# Patient Record
Sex: Female | Born: 1970 | Race: Black or African American | Hispanic: No | Marital: Married | Smoking: Never smoker
Health system: Southern US, Community
[De-identification: ages and names within clinical notes are randomized; demographics above are authoritative.]

## PROBLEM LIST (undated history)

## (undated) DIAGNOSIS — R42 Dizziness and giddiness: Secondary | ICD-10-CM

## (undated) DIAGNOSIS — Z9889 Other specified postprocedural states: Secondary | ICD-10-CM

## (undated) DIAGNOSIS — M25511 Pain in right shoulder: Secondary | ICD-10-CM

## (undated) DIAGNOSIS — J343 Hypertrophy of nasal turbinates: Secondary | ICD-10-CM

## (undated) DIAGNOSIS — E7800 Pure hypercholesterolemia, unspecified: Secondary | ICD-10-CM

## (undated) DIAGNOSIS — N200 Calculus of kidney: Secondary | ICD-10-CM

## (undated) DIAGNOSIS — J329 Chronic sinusitis, unspecified: Secondary | ICD-10-CM

## (undated) DIAGNOSIS — I10 Essential (primary) hypertension: Secondary | ICD-10-CM

## (undated) DIAGNOSIS — Z8489 Family history of other specified conditions: Secondary | ICD-10-CM

## (undated) DIAGNOSIS — R112 Nausea with vomiting, unspecified: Secondary | ICD-10-CM

## (undated) HISTORY — PX: LITHOTRIPSY: SUR834

---

## 2000-11-30 ENCOUNTER — Inpatient Hospital Stay (HOSPITAL_COMMUNITY): Admission: AD | Admit: 2000-11-30 | Discharge: 2000-11-30 | Payer: Self-pay | Admitting: Obstetrics & Gynecology

## 2001-01-22 ENCOUNTER — Inpatient Hospital Stay (HOSPITAL_COMMUNITY): Admission: AD | Admit: 2001-01-22 | Discharge: 2001-01-22 | Payer: Self-pay | Admitting: Obstetrics & Gynecology

## 2001-02-01 ENCOUNTER — Inpatient Hospital Stay (HOSPITAL_COMMUNITY): Admission: AD | Admit: 2001-02-01 | Discharge: 2001-02-01 | Payer: Self-pay | Admitting: Obstetrics & Gynecology

## 2001-02-15 ENCOUNTER — Inpatient Hospital Stay (HOSPITAL_COMMUNITY): Admission: AD | Admit: 2001-02-15 | Discharge: 2001-02-15 | Payer: Self-pay | Admitting: Obstetrics & Gynecology

## 2001-02-15 ENCOUNTER — Encounter: Payer: Self-pay | Admitting: Obstetrics

## 2001-02-18 ENCOUNTER — Inpatient Hospital Stay (HOSPITAL_COMMUNITY): Admission: AD | Admit: 2001-02-18 | Discharge: 2001-02-20 | Payer: Self-pay | Admitting: Obstetrics & Gynecology

## 2001-02-25 ENCOUNTER — Encounter: Admission: RE | Admit: 2001-02-25 | Discharge: 2001-03-27 | Payer: Self-pay | Admitting: Family Medicine

## 2002-01-05 ENCOUNTER — Emergency Department (HOSPITAL_COMMUNITY): Admission: EM | Admit: 2002-01-05 | Discharge: 2002-01-05 | Payer: Self-pay | Admitting: Emergency Medicine

## 2002-01-31 ENCOUNTER — Inpatient Hospital Stay (HOSPITAL_COMMUNITY): Admission: AD | Admit: 2002-01-31 | Discharge: 2002-01-31 | Payer: Self-pay | Admitting: *Deleted

## 2002-02-12 ENCOUNTER — Inpatient Hospital Stay (HOSPITAL_COMMUNITY): Admission: AD | Admit: 2002-02-12 | Discharge: 2002-02-14 | Payer: Self-pay | Admitting: *Deleted

## 2002-02-17 ENCOUNTER — Inpatient Hospital Stay (HOSPITAL_COMMUNITY): Admission: AD | Admit: 2002-02-17 | Discharge: 2002-02-21 | Payer: Self-pay | Admitting: *Deleted

## 2008-02-14 ENCOUNTER — Encounter: Admission: RE | Admit: 2008-02-14 | Discharge: 2008-02-14 | Payer: Self-pay | Admitting: Internal Medicine

## 2009-12-26 ENCOUNTER — Emergency Department (HOSPITAL_BASED_OUTPATIENT_CLINIC_OR_DEPARTMENT_OTHER): Admission: EM | Admit: 2009-12-26 | Discharge: 2009-12-26 | Payer: Self-pay | Admitting: Emergency Medicine

## 2009-12-26 ENCOUNTER — Ambulatory Visit: Payer: Self-pay | Admitting: Diagnostic Radiology

## 2011-05-16 NOTE — Discharge Summary (Signed)
[facility]  Patient:    Janet Webb, Janet Webb Visit Number: [id number] MRN: [id number]          Service Type: GYN Location: [id number] Attending Physician:  Michaelle Copas Dictated by:   Ed Blalock. Burnadette Peter, M.D. Admit Date:  02/17/2002 Discharge Date: 02/21/2002                             Discharge Summary  DATE OF BIRTH:  Feb 04, 1971  HISTORY OF PRESENT ILLNESS:  This is a 40 year old, G3, P2-1-0-3, who presented on postpartum day #5 with light-headedness and fatigue since the day prior to admission.  She also complained of hemorrhoids, and mid lower abdominal pain, feeling terrible, and weak x2 days.  She had positive bleeding the day prior to the day of admission.  She had no visual changes, and a positive frontal headache.  MEDICATIONS: 1. Motrin. 2. Depo. 3. Phenergan. 4. Colace.  PAST MEDICAL HISTORY: 1. Normal spontaneous vaginal delivery x3, last on 02/12/02. 2. Anemia.  Hemoglobin was 5.2 during the hospitalization.  PAST GYNECOLOGICAL HISTORY:  No STDs or abnormal Paps.  PAST SURGICAL HISTORY:  None.  HOSPITALIZATIONS:  Only with pregnancies.  SOCIAL HISTORY:  Denies tobacco, alcohol, or drug use.  ADMISSION PHYSICAL EXAMINATION:  VITAL SIGNS:  Temperature 98.8, heart rate 72 to 77, respiratory rate 20, blood pressure 203/107 and 176/99.  GENERAL:  She is laying in bed in no acute distress.  HEENT:  Oropharynx is clear.  Fundus examination was normal.  LUNGS:  Clear to auscultation.  NECK:  Supple.  There is no lymphadenopathy.  HEART:  Regular rate and rhythm without murmur.  BREASTS:  Leaking fluid.  ABDOMEN:  Fundus was firm and below the umbilicus.  No right upper quadrant pain or tenderness to palpation.  RECTAL:  Large hemorrhoids.  SKIN:  Within normal limits.  EXTREMITIES:  Deep tendon reflexes 3+ with one beat of clonus.  LABORATORY DATA:  Urinalysis showed specific gravity 1015, and  normal. Hemoglobin 7.1, white count 8.5, platelets 355.  HOSPITAL COURSE:  The patient was admitted for postpartum preeclampsia.  She received magnesium sulfate, and PIH labs were done which were normal with the exception of hypokalemia which was orally replaced.  Her magnesium was discontinued, and she was observed.  Given her elevated blood pressures into the 200s systolic, she was started on IV labetalol and then a labetalol drip. This was discontinued, and then she was placed on p.o. labetalol 300 mg b.i.d. She continued to have blood pressure elevations with the labetalol, in addition to intermittent IV labetalol 10 and 20 mg.  On hospital day #5, she was placed on Procardia XL in the a.m.  She tolerated this well.  Her blood pressures trended down with blood pressures to 146/82, then 135/92, then 120/74.  The patient was completely asymptomatic at this time.  Her physical examination was consistent with the a.m. physical examination with no neurological deficits.  Cardiovascular was regular rate and rhythm.  Her lungs were clear.  There was no edema.  She had 2+ deep tendon reflexes at discharge.  She also had no visual changes or headache.  DIET:  Low salt diet.  ACTIVITY:  Ad lib.  DISCHARGE MEDICATIONS: 1. Procardia XL 60 mg p.o. q.d. 2. Resume her previous postpartum medications as directed.  FOLLOWUP:  She is to be seen by Dr. Waylan Boga within two days for blood pressure check, and then she needs  a routine follow by Dr. Waylan Boga, who is her primary physician for management of her blood pressure issues and gestational hypertension/preeclampsia.  CONDITION ON DISCHARGE:  Improved.  DISCHARGE DIAGNOSES: 1. Status post normal spontaneous vaginal delivery. 2. Preeclampsia during delivery. 3. Postpartum preeclampsia versus elevated blood pressures with chronic    hypertension.Dictated by:   Ed Blalock. Burnadette Peter, M.D. Attending Physician:  Michaelle Copas DD:  02/21/02 TD:   02/22/02 Job: [id number]

## 2013-03-08 ENCOUNTER — Emergency Department (HOSPITAL_BASED_OUTPATIENT_CLINIC_OR_DEPARTMENT_OTHER)
Admission: EM | Admit: 2013-03-08 | Discharge: 2013-03-09 | Disposition: A | Discharge status: Home / Self Care | Payer: BC Managed Care – PPO | Attending: Emergency Medicine | Admitting: Emergency Medicine

## 2013-03-08 ENCOUNTER — Emergency Department (HOSPITAL_BASED_OUTPATIENT_CLINIC_OR_DEPARTMENT_OTHER): Payer: BC Managed Care – PPO

## 2013-03-08 ENCOUNTER — Encounter (HOSPITAL_BASED_OUTPATIENT_CLINIC_OR_DEPARTMENT_OTHER): Payer: Self-pay | Admitting: Emergency Medicine

## 2013-03-08 DIAGNOSIS — E7800 Pure hypercholesterolemia, unspecified: Secondary | ICD-10-CM | POA: Insufficient documentation

## 2013-03-08 DIAGNOSIS — IMO0001 Reserved for inherently not codable concepts without codable children: Secondary | ICD-10-CM | POA: Insufficient documentation

## 2013-03-08 DIAGNOSIS — I10 Essential (primary) hypertension: Secondary | ICD-10-CM | POA: Insufficient documentation

## 2013-03-08 DIAGNOSIS — M62838 Other muscle spasm: Secondary | ICD-10-CM

## 2013-03-08 DIAGNOSIS — Z87442 Personal history of urinary calculi: Secondary | ICD-10-CM | POA: Insufficient documentation

## 2013-03-08 DIAGNOSIS — Z79899 Other long term (current) drug therapy: Secondary | ICD-10-CM | POA: Insufficient documentation

## 2013-03-08 HISTORY — DX: Pure hypercholesterolemia, unspecified: E78.00

## 2013-03-08 HISTORY — DX: Essential (primary) hypertension: I10

## 2013-03-08 HISTORY — DX: Calculus of kidney: N20.0

## 2013-03-08 MED ORDER — METHOCARBAMOL 500 MG PO TABS
500.0000 mg | ORAL_TABLET | Freq: Once | ORAL | Status: AC
  Administered 2013-03-09: 500 mg via ORAL
  Filled 2013-03-08: qty 1

## 2013-03-08 MED ORDER — TRAMADOL HCL 50 MG PO TABS
50.0000 mg | ORAL_TABLET | Freq: Once | ORAL | Status: DC
  Filled 2013-03-08: qty 1

## 2013-03-08 NOTE — ED Provider Notes (Signed)
History     CSN: [id number]  Arrival date & time 03/08/13  2324   First MD Initiated Contact with Patient 03/08/13 2338      Chief Complaint  Patient presents with  . Neck Pain    (Consider location/radiation/quality/duration/timing/severity/associated sxs/prior treatment) Patient is a 42 y.o. female presenting with neck pain. The history is provided by the patient.  Neck Pain Pain location: r> L. Quality:  Cramping Pain radiates to:  L shoulder and R shoulder Pain severity:  Moderate Pain is:  Same all the time Onset quality:  Sudden (slept on it funny sunday night) Timing:  Constant Progression:  Unchanged Chronicity:  Recurrent Context: not fall and not recent injury   Relieved by:  Nothing Associated symptoms: no fever     Past Medical History  Diagnosis Date  . Hypertension   . High cholesterol   . Kidney stones     Past Surgical History  Procedure Laterality Date  . Lithotripsy      No family history on file.  History  Substance Use Topics  . Smoking status: Never Smoker   . Smokeless tobacco: Not on file  . Alcohol Use: Yes     Comment: rarely    OB History   Grav Para Term Preterm Abortions TAB SAB Ect Mult Living                  Review of Systems  Constitutional: Negative for fever.  HENT: Positive for neck pain.   Skin: Negative for rash.  All other systems reviewed and are negative.    Allergies  Codeine  Home Medications   Current Outpatient Rx  Name  Route  Sig  Dispense  Refill  . lisinopril-hydrochlorothiazide (PRINZIDE,ZESTORETIC) 10-12.5 MG per tablet   Oral   Take 1 tablet by mouth daily.         . montelukast (SINGULAIR) 10 MG tablet   Oral   Take 10 mg by mouth at bedtime.         . pravastatin (PRAVACHOL) 40 MG tablet   Oral   Take 40 mg by mouth daily.           BP 132/85  Pulse 74  Temp(Src) 97.9 F (36.6 C) (Oral)  Resp 16  Ht 5\' 5"  (1.651 m)  Wt 155 lb (70.308 kg)  BMI 25.79 kg/m2  SpO2  100%  LMP 02/15/2013  Physical Exam  Constitutional: She is oriented to person, place, and time. She appears well-developed and well-nourished. No distress.  HENT:  Head: Normocephalic and atraumatic.  Mouth/Throat: Oropharynx is clear and moist. No oropharyngeal exudate.  Eyes: Conjunctivae are normal. Pupils are equal, round, and reactive to light.  Neck: Normal range of motion. Neck supple. No tracheal deviation present. No thyromegaly present.  Spasm in the trapezius muscle  Cardiovascular: Normal rate, regular rhythm and intact distal pulses.   Pulmonary/Chest: Effort normal and breath sounds normal. She has no wheezes. She has no rales.  Abdominal: Soft. Bowel sounds are normal. There is no tenderness. There is no rebound and no guarding.  Musculoskeletal: Normal range of motion. She exhibits no tenderness.  Lymphadenopathy:    She has no cervical adenopathy.  Neurological: She is alert and oriented to person, place, and time. She has normal reflexes. She exhibits normal muscle tone.  5/5 strength x 4  Skin: Skin is warm and dry.  Psychiatric: She has a normal mood and affect.    ED Course  Procedures (including critical care  time)  Labs Reviewed - No data to display No results found.   No diagnosis found.    MDM  Neck spasms.  Will treat for pain and spasm.         Jasmine Awe, MD 03/08/13 2355

## 2013-03-08 NOTE — ED Notes (Signed)
Woke up Monday morning with stiff neck. Has been taking flexeril, naproxen, and Ibuprofen and it has not gotten any better.  No known injury.

## 2013-03-08 NOTE — ED Notes (Signed)
Awakened on 03/06/13 with muscle pain in her neck.  Bilateral shoulders feel tense.  She thinks she slept on her neck wrong.  Has taken Flexeril, IBU, and Naproxen with minimal relief.  Movement worsens the pain. Full ROM in neck.  Denies rash or fever.

## 2013-03-09 MED ORDER — MELOXICAM 7.5 MG PO TABS: 7.5000 mg | ORAL_TABLET | Freq: Every day | ORAL | Status: DC

## 2013-03-09 MED ORDER — METHOCARBAMOL 750 MG PO TABS: 750.0000 mg | ORAL_TABLET | Freq: Two times a day (BID) | ORAL | Status: DC

## 2013-03-09 NOTE — ED Notes (Signed)
Pt. States she can't take Ultram, makes her vomit.

## 2013-10-26 ENCOUNTER — Encounter (HOSPITAL_BASED_OUTPATIENT_CLINIC_OR_DEPARTMENT_OTHER): Payer: Self-pay | Admitting: Emergency Medicine

## 2013-10-26 ENCOUNTER — Emergency Department (HOSPITAL_BASED_OUTPATIENT_CLINIC_OR_DEPARTMENT_OTHER)
Admission: EM | Admit: 2013-10-26 | Discharge: 2013-10-26 | Disposition: A | Discharge status: Home / Self Care | Payer: BC Managed Care – PPO | Attending: Emergency Medicine | Admitting: Emergency Medicine

## 2013-10-26 DIAGNOSIS — I10 Essential (primary) hypertension: Secondary | ICD-10-CM | POA: Insufficient documentation

## 2013-10-26 DIAGNOSIS — Z87442 Personal history of urinary calculi: Secondary | ICD-10-CM | POA: Insufficient documentation

## 2013-10-26 DIAGNOSIS — Z79899 Other long term (current) drug therapy: Secondary | ICD-10-CM | POA: Insufficient documentation

## 2013-10-26 DIAGNOSIS — E7800 Pure hypercholesterolemia, unspecified: Secondary | ICD-10-CM | POA: Insufficient documentation

## 2013-10-26 DIAGNOSIS — M5382 Other specified dorsopathies, cervical region: Secondary | ICD-10-CM | POA: Insufficient documentation

## 2013-10-26 DIAGNOSIS — Z791 Long term (current) use of non-steroidal anti-inflammatories (NSAID): Secondary | ICD-10-CM | POA: Insufficient documentation

## 2013-10-26 DIAGNOSIS — M542 Cervicalgia: Secondary | ICD-10-CM

## 2013-10-26 MED ORDER — MELOXICAM 7.5 MG PO TABS: 7.5000 mg | ORAL_TABLET | Freq: Every day | ORAL | Status: AC

## 2013-10-26 MED ORDER — METHOCARBAMOL 750 MG PO TABS: 750.0000 mg | ORAL_TABLET | Freq: Two times a day (BID) | ORAL | Status: DC

## 2013-10-26 MED ORDER — KETOROLAC TROMETHAMINE 60 MG/2ML IM SOLN
60.0000 mg | Freq: Once | INTRAMUSCULAR | Status: AC
  Administered 2013-10-26: 60 mg via INTRAMUSCULAR
  Filled 2013-10-26: qty 2

## 2013-10-26 NOTE — ED Provider Notes (Signed)
CSN: [id number]     Arrival date & time 10/26/13  0906 History   First MD Initiated Contact with Patient 10/26/13 [phone]     Chief Complaint  Patient presents with  . muscle spasm right shoulder    (Consider location/radiation/quality/duration/timing/severity/associated sxs/prior Treatment) The history is provided by the patient.   Patient here complaining of a one-week history of right-sided upper trapezius pain. Pain is worse with certain movements and characterized as sharp. History of similar symptoms associated with muscle strain and has been treated before with Robaxin and Mobic. 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 She denies any pain in his shoulder. No joint tenderness or swelling. States she feels it might be related to reduce his legs. No numbness or tingling in her right hand. Past Medical History  Diagnosis Date  . Hypertension   . High cholesterol   . Kidney stones    Past Surgical History  Procedure Laterality Date  . Lithotripsy     History reviewed. No pertinent family history. History  Substance Use Topics  . Smoking status: Never Smoker   . Smokeless tobacco: Not on file  . Alcohol Use: Yes     Comment: rarely   OB History   Grav Para Term Preterm Abortions TAB SAB Ect Mult Living                 Review of Systems  All other systems reviewed and are negative.    Allergies  Codeine  Home Medications   Current Outpatient Rx  Name  Route  Sig  Dispense  Refill  . lisinopril-hydrochlorothiazide (PRINZIDE,ZESTORETIC) 10-12.5 MG per tablet   Oral   Take 1 tablet by mouth daily.         . meloxicam (MOBIC) 7.5 MG tablet   Oral   Take 1 tablet (7.5 mg total) by mouth daily. Take with food   7 tablet   0   . methocarbamol (ROBAXIN) 750 MG tablet   Oral   Take 1 tablet (750 mg total) by mouth 2 (two) times daily.   20 tablet   0   . montelukast (SINGULAIR) 10 MG tablet   Oral  Take 10 mg by mouth at bedtime.         . pravastatin (PRAVACHOL) 40 MG tablet   Oral   Take 40 mg by mouth daily.          BP 134/76  Pulse 81  Resp 16  SpO2 100%  LMP 10/26/2013 Physical Exam  Nursing note and vitals reviewed. Constitutional: She is oriented to person, place, and time. She appears well-developed and well-nourished.  Non-toxic appearance. No distress.  HENT:  Head: Normocephalic and atraumatic.  Eyes: Conjunctivae, EOM and lids are normal. Pupils are equal, round, and reactive to light.  Neck: Normal range of motion. Neck supple. No tracheal deviation present. No mass present.  Cardiovascular: Normal rate, regular rhythm and normal heart sounds.  Exam reveals no gallop.   No murmur heard. Pulmonary/Chest: Effort normal and breath sounds normal. No stridor. No respiratory distress. She has no decreased breath sounds. She has no wheezes. She has no rhonchi. She has no rales.  Abdominal: Soft. Normal appearance and bowel sounds are normal. She exhibits no distension. There is no tenderness. There is no rebound and no CVA tenderness.  Musculoskeletal: Normal range of motion. She exhibits no edema and no tenderness.  Arms: Neurological: She is alert and oriented to person, place, and time. She has normal strength. No cranial nerve deficit or sensory deficit. GCS eye subscore is 4. GCS verbal subscore is 5. GCS motor subscore is 6.  Skin: Skin is warm and dry. No abrasion and no rash noted.  Psychiatric: She has a normal mood and affect. Her speech is normal and behavior is normal.    ED Course  Procedures (including critical care time) Labs Review Labs Reviewed - No data to display Imaging Review No results found.  EKG Interpretation   None       MDM  No diagnosis found. Patient with out evidence of joint instability. Pain is pinpoint at her right upper trapezius. Suspect that she has a trigger point issue. Given Toradol IM here and will give  referral to sports medicine.    Toy Baker, MD 10/26/13 1003

## 2013-10-26 NOTE — ED Notes (Signed)
Thinks has been sleeping wrong on right shoulder having muscle spasms and tightness from top of right shoulder to neck.has taken naprosyn etc with no relief. States she works for a [employer] and they told her " to go ahead and come to urgent care and get an xray to be sure all is ok"

## 2013-11-01 ENCOUNTER — Ambulatory Visit: Payer: BC Managed Care – PPO | Admitting: Family Medicine

## 2015-01-23 ENCOUNTER — Other Ambulatory Visit: Payer: Self-pay | Admitting: Orthopaedic Surgery

## 2015-01-23 DIAGNOSIS — M545 Low back pain: Secondary | ICD-10-CM

## 2015-02-06 ENCOUNTER — Ambulatory Visit
Admission: RE | Admit: 2015-02-06 | Discharge: 2015-02-06 | Disposition: A | Discharge status: Home / Self Care | Payer: BLUE CROSS/BLUE SHIELD | Source: Ambulatory Visit | Attending: Orthopaedic Surgery | Admitting: Orthopaedic Surgery

## 2015-02-06 DIAGNOSIS — M545 Low back pain: Secondary | ICD-10-CM

## 2015-06-17 ENCOUNTER — Encounter (HOSPITAL_BASED_OUTPATIENT_CLINIC_OR_DEPARTMENT_OTHER): Payer: Self-pay | Admitting: *Deleted

## 2015-06-17 ENCOUNTER — Emergency Department (HOSPITAL_BASED_OUTPATIENT_CLINIC_OR_DEPARTMENT_OTHER)
Admission: EM | Admit: 2015-06-17 | Discharge: 2015-06-17 | Disposition: A | Discharge status: Home / Self Care | Payer: BLUE CROSS/BLUE SHIELD | Attending: Emergency Medicine | Admitting: Emergency Medicine

## 2015-06-17 DIAGNOSIS — Z791 Long term (current) use of non-steroidal anti-inflammatories (NSAID): Secondary | ICD-10-CM | POA: Diagnosis not present

## 2015-06-17 DIAGNOSIS — Z87442 Personal history of urinary calculi: Secondary | ICD-10-CM | POA: Insufficient documentation

## 2015-06-17 DIAGNOSIS — Z8639 Personal history of other endocrine, nutritional and metabolic disease: Secondary | ICD-10-CM | POA: Insufficient documentation

## 2015-06-17 DIAGNOSIS — Z79899 Other long term (current) drug therapy: Secondary | ICD-10-CM | POA: Insufficient documentation

## 2015-06-17 DIAGNOSIS — J069 Acute upper respiratory infection, unspecified: Secondary | ICD-10-CM | POA: Insufficient documentation

## 2015-06-17 DIAGNOSIS — J029 Acute pharyngitis, unspecified: Secondary | ICD-10-CM | POA: Diagnosis present

## 2015-06-17 DIAGNOSIS — I10 Essential (primary) hypertension: Secondary | ICD-10-CM | POA: Diagnosis not present

## 2015-06-17 LAB — RAPID STREP SCREEN (MED CTR MEBANE ONLY): Streptococcus, Group A Screen (Direct): NEGATIVE

## 2015-06-17 MED ORDER — ALBUTEROL SULFATE HFA 108 (90 BASE) MCG/ACT IN AERS
2.0000 | INHALATION_SPRAY | RESPIRATORY_TRACT | Status: DC | PRN
  Administered 2015-06-17: 2 via RESPIRATORY_TRACT
  Filled 2015-06-17: qty 6.7

## 2015-06-17 NOTE — Discharge Instructions (Signed)
Upper Respiratory Infection, Adult An upper respiratory infection (URI) is also sometimes known as the common cold. The upper respiratory tract includes the nose, sinuses, throat, trachea, and bronchi. Bronchi are the airways leading to the lungs. Most people improve within 1 week, but symptoms can last up to 2 weeks. A residual cough may last even longer.  CAUSES Many different viruses can infect the tissues lining the upper respiratory tract. The tissues become irritated and inflamed and often become very moist. Mucus production is also common. A cold is contagious. You can easily spread the virus to others by oral contact. This includes kissing, sharing a glass, coughing, or sneezing. Touching your mouth or nose and then touching a surface, which is then touched by another person, can also spread the virus. SYMPTOMS  Symptoms typically develop 1 to 3 days after you come in contact with a cold virus. Symptoms vary from person to person. They may include:  Runny nose.  Sneezing.  Nasal congestion.  Sinus irritation.  Sore throat.  Loss of voice (laryngitis).  Cough.  Fatigue.  Muscle aches.  Loss of appetite.  Headache.  Low-grade fever. DIAGNOSIS  You might diagnose your own cold based on familiar symptoms, since most people get a cold 2 to 3 times a year. Your caregiver can confirm this based on your exam. Most importantly, your caregiver can check that your symptoms are not due to another disease such as strep throat, sinusitis, pneumonia, asthma, or epiglottitis. Blood tests, throat tests, and X-rays are not necessary to diagnose a common cold, but they may sometimes be helpful in excluding other more serious diseases. Your caregiver will decide if any further tests are required. RISKS AND COMPLICATIONS  You may be at risk for a more severe case of the common cold if you smoke cigarettes, have chronic heart disease (such as heart failure) or lung disease (such as asthma), or if  you have a weakened immune system. The very young and very old are also at risk for more serious infections. Bacterial sinusitis, middle ear infections, and bacterial pneumonia can complicate the common cold. The common cold can worsen asthma and chronic obstructive pulmonary disease (COPD). Sometimes, these complications can require emergency medical care and may be life-threatening. PREVENTION  The best way to protect against getting a cold is to practice good hygiene. Avoid oral or hand contact with people with cold symptoms. Wash your hands often if contact occurs. There is no clear evidence that vitamin C, vitamin E, echinacea, or exercise reduces the chance of developing a cold. However, it is always recommended to get plenty of rest and practice good nutrition. TREATMENT  Treatment is directed at relieving symptoms. There is no cure. Antibiotics are not effective, because the infection is caused by a virus, not by bacteria. Treatment may include:  Increased fluid intake. Sports drinks offer valuable electrolytes, sugars, and fluids.  Breathing heated mist or steam (vaporizer or shower).  Eating chicken soup or other clear broths, and maintaining good nutrition.  Getting plenty of rest.  Using gargles or lozenges for comfort.  Controlling fevers with ibuprofen or acetaminophen as directed by your caregiver.  Increasing usage of your inhaler if you have asthma. Zinc gel and zinc lozenges, taken in the first 24 hours of the common cold, can shorten the duration and lessen the severity of symptoms. Pain medicines may help with fever, muscle aches, and throat pain. A variety of non-prescription medicines are available to treat congestion and runny nose. Your caregiver   can make recommendations and may suggest nasal or lung inhalers for other symptoms.  HOME CARE INSTRUCTIONS   Only take over-the-counter or prescription medicines for pain, discomfort, or fever as directed by your  caregiver.  Use a warm mist humidifier or inhale steam from a shower to increase air moisture. This may keep secretions moist and make it easier to breathe.  Drink enough water and fluids to keep your urine clear or pale yellow.  Rest as needed.  Return to work when your temperature has returned to normal or as your caregiver advises. You may need to stay home longer to avoid infecting others. You can also use a face mask and careful hand washing to prevent spread of the virus. SEEK MEDICAL CARE IF:   After the first few days, you feel you are getting worse rather than better.  You need your caregiver's advice about medicines to control symptoms.  You develop chills, worsening shortness of breath, or brown or red sputum. These may be signs of pneumonia.  You develop yellow or brown nasal discharge or pain in the face, especially when you bend forward. These may be signs of sinusitis.  You develop a fever, swollen neck glands, pain with swallowing, or white areas in the back of your throat. These may be signs of strep throat. SEEK IMMEDIATE MEDICAL CARE IF:   You have a fever.  You develop severe or persistent headache, ear pain, sinus pain, or chest pain.  You develop wheezing, a prolonged cough, cough up blood, or have a change in your usual mucus (if you have chronic lung disease).  You develop sore muscles or a stiff neck. Document Released: 06/10/2001 Document Revised: 03/08/2012 Document Reviewed: 03/22/2014 Exit[employer] Patient Information 2015 [employer]. This information is not intended to replace advice given to you by your health care provider. Make sure you discuss any questions you have with your health care provider.  

## 2015-06-17 NOTE — ED Notes (Signed)
Sore throat for the past week,, chills at time

## 2015-06-17 NOTE — ED Provider Notes (Signed)
CSN: [id number]     Arrival date & time 06/17/15  1053 History   First MD Initiated Contact with Patient 06/17/15 1100     Chief Complaint  Patient presents with  . Sore Throat     (Consider location/radiation/quality/duration/timing/severity/associated sxs/prior Treatment) HPI 44 year old female comes in today complaining of upper respiratory infection symptoms. She states that it began about a week ago with some sinus pressure and congestion. She then began having some sore throat and cough. The coughing awoke her during the night. She has had some chills but has not had any fever. She has not had any nausea or vomiting. Active cough of discolored sputum. She has not been dyspneic except sometimes at night when she wakes up coughing. He has no previous history of pneumonia. Past Medical History  Diagnosis Date  . Hypertension   . High cholesterol   . Kidney stones    Past Surgical History  Procedure Laterality Date  . Lithotripsy     History reviewed. No pertinent family history. History  Substance Use Topics  . Smoking status: Never Smoker   . Smokeless tobacco: Not on file  . Alcohol Use: Yes     Comment: rarely   OB History    No data available     Review of Systems    Allergies  Codeine  Home Medications   Prior to Admission medications   Medication Sig Start Date End Date Taking? Authorizing Provider  ezetimibe (ZETIA) 10 MG tablet Take 10 mg by mouth daily.   Yes Historical Provider, MD  HYDROCHLOROTHIAZIDE PO Take by mouth.   Yes Historical Provider, MD  LOSARTAN POTASSIUM PO Take by mouth.   Yes Historical Provider, MD  lisinopril-hydrochlorothiazide (PRINZIDE,ZESTORETIC) 10-12.5 MG per tablet Take 1 tablet by mouth daily.    Historical Provider, MD  meloxicam (MOBIC) 7.5 MG tablet Take 1 tablet (7.5 mg total) by mouth daily. Take with food 10/26/13   Lacretia Leigh, MD  methocarbamol (ROBAXIN) 750 MG tablet Take 1 tablet (750 mg total) by mouth 2 (two)  times daily. 10/26/13   Lacretia Leigh, MD  montelukast (SINGULAIR) 10 MG tablet Take 10 mg by mouth at bedtime.    Historical Provider, MD  pravastatin (PRAVACHOL) 40 MG tablet Take 40 mg by mouth daily.    Historical Provider, MD   BP 130/90 mmHg  Pulse 77  Temp(Src) 98.3 F (36.8 C) (Oral)  Resp 18  Ht 5' 5"  (1.651 m)  Wt 165 lb (74.844 kg)  BMI 27.46 kg/m2  SpO2 100% Physical Exam  Constitutional: She is oriented to person, place, and time. She appears well-developed and well-nourished.  HENT:  Head: Normocephalic and atraumatic.  Right Ear: External ear normal.  Left Ear: External ear normal.  Nose: Nose normal.  Mouth/Throat: Oropharynx is clear and moist.  Eyes: Conjunctivae and EOM are normal. Pupils are equal, round, and reactive to light.  Neck: Normal range of motion. Neck supple.  Cardiovascular: Normal rate, regular rhythm, normal heart sounds and intact distal pulses.   Pulmonary/Chest: Effort normal and breath sounds normal.  Abdominal: Soft. Bowel sounds are normal.  Musculoskeletal: Normal range of motion.  Neurological: She is alert and oriented to person, place, and time. She has normal reflexes.  Skin: Skin is warm and dry.  Psychiatric: She has a normal mood and affect. Her behavior is normal. Judgment and thought content normal.  Nursing note and vitals reviewed.   ED Course  Procedures (including critical care time) Labs Review Labs Reviewed  RAPID STREP SCREEN (NOT AT [facility])  CULTURE, GROUP A STREP    Imaging Review No results found.   EKG Interpretation None      MDM   Final diagnoses:  URI (upper respiratory infection)   44 year old female with symptoms consistent with upper respiratory infection. Patient had strep screen done which is negative. She is given albuterol HFA for cough. Advise regarding return precautions and need for follow-up.  Pattricia Boss, MD 06/19/15 [phone]

## 2015-06-20 LAB — CULTURE, GROUP A STREP

## 2015-11-02 ENCOUNTER — Encounter: Payer: Self-pay | Admitting: *Deleted

## 2015-11-07 NOTE — Discharge Instructions (Signed)
[facility]  What is Functional Endoscopic Sinus Surgery?  The Surgery involves making the natural openings of the sinuses larger by removing the bony partitions that separate the sinuses from the nasal cavity.  The natural sinus lining is preserved as much as possible to allow the sinuses to resume normal function after the surgery.  In some patients nasal polyps (excessively swollen lining of the sinuses) may be removed to relieve obstruction of the sinus openings.  The surgery is performed through the nose using lighted scopes, which eliminates the need for incisions on the face.  A septoplasty is a different procedure which is sometimes performed with sinus surgery.  It involves straightening the boy partition that separates the two sides of your nose.  A crooked or deviated septum may need repair if is obstructing the sinuses or nasal airflow.  Turbinate reduction is also often performed during sinus surgery.  The turbinates are bony proturberances from the side walls of the nose which swell and can obstruct the nose in patients with sinus and allergy problems.  Their size can be surgically reduced to help relieve nasal obstruction.  What Can Sinus Surgery Do For Me?  Sinus surgery can reduce the frequency of sinus infections requiring antibiotic treatment.  This can provide improvement in nasal congestion, post-nasal drainage, facial pressure and nasal obstruction.  Surgery will NOT prevent you from ever having an infection again, so it usually only for patients who get infections 4 or more times yearly requiring antibiotics, or for infections that do not clear with antibiotics.  It will not cure nasal allergies, so patients with allergies may still require medication to treat their allergies after surgery. Surgery may improve headaches related to sinusitis, however, some people will continue to  require medication to control sinus headaches related to allergies.  Surgery will do nothing for other forms of headache (migraine, tension or cluster).  What Are the Risks of Endoscopic Sinus Surgery?  Current techniques allow surgery to be performed safely with little risk, however, there are rare complications that patients should be aware of.  Because the sinuses are located around the eyes, there is risk of eye injury, including blindness, though again, this would be quite rare. This is usually a result of bleeding behind the eye during surgery, which puts the vision oat risk, though there are treatments to protect the vision and prevent permanent disrupted by surgery causing a leak of the spinal fluid that surrounds the brain.  More serious complications would include bleeding inside the brain cavity or damage to the brain.  Again, all of these complications are uncommon, and spinal fluid leaks can be safely managed surgically if they occur.  The most common complication of sinus surgery is bleeding from the nose, which may require packing or cauterization of the nose.  Continued sinus have polyps may experience recurrence of the polyps requiring revision surgery.  Alterations of sense of smell or injury to the tear ducts are also rare complications.   What is the Surgery Like, and what is the Recovery?  The Surgery usually takes a couple of hours to perform, and is usually performed under a general anesthetic (completely asleep).  Patients are usually discharged home after a couple of hours.  Sometimes during surgery it is necessary to pack the nose to control bleeding, and the packing is left in place for 24 - 48 hours, and removed by your surgeon.  If a septoplasty was performed during the procedure, there is often a splint placed which must be removed after 5-7 days.   Discomfort: Pain is usually mild to moderate, and can be controlled by prescription pain medication or acetaminophen (Tylenol).   Aspirin, Ibuprofen (Advil, Motrin), or Naprosyn (Aleve) should be avoided, as they can cause increased bleeding.  Most patients feel sinus pressure like they have a bad head cold for several days.  Sleeping with your head elevated can help reduce swelling and facial pressure, as can ice packs over the face.  A humidifier may be helpful to keep the mucous and blood from drying in the nose.   Diet: There are no specific diet restrictions, however, you should generally start with clear liquids and a light diet of bland foods because the anesthetic can cause some nausea.  Advance your diet depending on how your stomach feels.  Taking your pain medication with food will often help reduce stomach upset which pain medications can cause.  Nasal Saline Irrigation: It is important to remove blood clots and dried mucous from the nose as it is healing.  This is done by having you irrigate the nose at least 3 - 4 times daily with a salt water solution.  We recommend using NeilMed Sinus Rinse (available at the drug store).  Fill the squeeze bottle with the solution, bend over a sink, and insert the tip of the squeeze bottle into the nose  of an inch.  Point the tip of the squeeze bottle towards the inside corner of the eye on the same side your irrigating.  Squeeze the bottle and gently irrigate the nose.  If you bend forward as you do this, most of the fluid will flow back out of the nose, instead of down your throat.   The solution should be warm, near body temperature, when you irrigate.   Each time you irrigate, you should use a full squeeze bottle.   Note that if you are instructed to use Nasal Steroid Sprays at any time after your surgery, irrigate with saline BEFORE using the steroid spray, so you do not wash it all out of the nose. Another product, Nasal Saline Gel (such as AYR Nasal Saline Gel) can be applied in each nostril 3 - 4 times daily to moisture the nose and reduce scabbing or crusting.  Bleeding:   Bloody drainage from the nose can be expected for several days, and patients are instructed to irrigate their nose frequently with salt water to help remove mucous and blood clots.  The drainage may be dark red or brown, though some fresh blood may be seen intermittently, especially after irrigation.  Do not blow you nose, as bleeding may occur. If you must sneeze, keep your mouth open to allow air to escape through your mouth.  If heavy bleeding occurs: Irrigate the nose with saline to rinse out clots, then spray the nose 3 - 4 times with Afrin Nasal Decongestant Spray.  The spray will constrict the blood vessels to slow bleeding.  Pinch the lower half of your nose shut to apply pressure, and lay down with your head elevated.  Ice packs over the nose may help as well. If bleeding persists despite these measures, you should notify your doctor.  Do not use the Afrin routinely to control nasal congestion after surgery, as it can result in worsening congestion and may affect healing.     Activity: Return to work varies among patients. Most patients will be  out of work at least 5 - 7 days to recover.  Patient may return to work after they are off of narcotic pain medication, and feeling well enough to perform the functions of their job.  Patients must avoid heavy lifting (over 10 pounds) or strenuous physical for 2 weeks after surgery, so your employer may need to assign you to light duty, or keep you out of work longer if light duty is not possible.  NOTE: you should not drive, operate dangerous machinery, do any mentally demanding tasks or make any important legal or financial decisions while on narcotic pain medication and recovering from the general anesthetic.    Call Your Doctor Immediately if You Have Any of the Following: 1. Bleeding that you cannot control with the above measures 2. Loss of vision, double vision, bulging of the eye or black eyes. 3. Fever over 101 degrees 4. Neck stiffness with  severe headache, fever, nausea and change in mental state. You are always encourage to call anytime with concerns, however, please call with requests for pain medication refills during office hours.  Office Endoscopy: During follow-up visits your doctor will remove any packing or splints that may have been placed and evaluate and clean your sinuses endoscopically.  Topical anesthetic will be used to make this as comfortable as possible, though you may want to take your pain medication prior to the visit.  How often this will need to be done varies from patient to patient.  After complete recovery from the surgery, you may need follow-up endoscopy from time to time, particularly if there is concern of recurrent infection or nasal polyps.   General Anesthesia, Adult, Care After Refer to this sheet in the next few weeks. These instructions provide you with information on caring for yourself after your procedure. Your health care provider may also give you more specific instructions. Your treatment has been planned according to current medical practices, but problems sometimes occur. Call your health care provider if you have any problems or questions after your procedure. WHAT TO EXPECT AFTER THE PROCEDURE After the procedure, it is typical to experience:  Sleepiness.  Nausea and vomiting. HOME CARE INSTRUCTIONS  For the first 24 hours after general anesthesia:  Have a responsible person with you.  Do not drive a car. If you are alone, do not take public transportation.  Do not drink alcohol.  Do not take medicine that has not been prescribed by your health care provider.  Do not sign important papers or make important decisions.  You may resume a normal diet and activities as directed by your health care provider.  Change bandages (dressings) as directed.  If you have questions or problems that seem related to general anesthesia, call the hospital and ask for the anesthetist or  anesthesiologist on call. SEEK MEDICAL CARE IF:  You have nausea and vomiting that continue the day after anesthesia.  You develop a rash. SEEK IMMEDIATE MEDICAL CARE IF:   You have difficulty breathing.  You have chest pain.  You have any allergic problems.   This information is not intended to replace advice given to you by your health care provider. Make sure you discuss any questions you have with your health care provider.   Document Released: 03/23/2001 Document Revised: 01/05/2015 Document Reviewed: 04/14/2012 Elsevier Interactive Patient Education [employer].

## 2015-11-08 ENCOUNTER — Encounter
Admission: RE | Disposition: A | Discharge status: Home / Self Care | Payer: Self-pay | Source: Ambulatory Visit | Attending: Otolaryngology

## 2015-11-08 ENCOUNTER — Encounter: Payer: Self-pay | Admitting: Otolaryngology

## 2015-11-08 ENCOUNTER — Ambulatory Visit
Admission: RE | Admit: 2015-11-08 | Discharge: 2015-11-08 | Disposition: A | Discharge status: Home / Self Care | Payer: BLUE CROSS/BLUE SHIELD | Source: Ambulatory Visit | Attending: Otolaryngology | Admitting: Otolaryngology

## 2015-11-08 ENCOUNTER — Ambulatory Visit: Payer: BLUE CROSS/BLUE SHIELD | Admitting: Anesthesiology

## 2015-11-08 DIAGNOSIS — I10 Essential (primary) hypertension: Secondary | ICD-10-CM | POA: Insufficient documentation

## 2015-11-08 DIAGNOSIS — Z885 Allergy status to narcotic agent status: Secondary | ICD-10-CM | POA: Insufficient documentation

## 2015-11-08 DIAGNOSIS — Z87442 Personal history of urinary calculi: Secondary | ICD-10-CM | POA: Diagnosis not present

## 2015-11-08 DIAGNOSIS — J343 Hypertrophy of nasal turbinates: Secondary | ICD-10-CM | POA: Diagnosis not present

## 2015-11-08 DIAGNOSIS — J3489 Other specified disorders of nose and nasal sinuses: Secondary | ICD-10-CM | POA: Insufficient documentation

## 2015-11-08 HISTORY — DX: Hypertrophy of nasal turbinates: J34.3

## 2015-11-08 HISTORY — DX: Pain in right shoulder: M25.511

## 2015-11-08 HISTORY — DX: Other specified postprocedural states: Z98.890

## 2015-11-08 HISTORY — DX: Family history of other specified conditions: Z84.89

## 2015-11-08 HISTORY — DX: Dizziness and giddiness: R42

## 2015-11-08 HISTORY — PX: TURBINATE REDUCTION: SHX6157

## 2015-11-08 HISTORY — DX: Other specified postprocedural states: R11.2

## 2015-11-08 HISTORY — DX: Chronic sinusitis, unspecified: J32.9

## 2015-11-08 HISTORY — PX: ENDOSCOPIC CONCHA BULLOSA RESECTION: SHX6395

## 2015-11-08 SURGERY — EXCISION, CONCHA BULLOSA, ENDOSCOPIC
Anesthesia: General | Laterality: Bilateral

## 2015-11-08 MED ORDER — ACETAMINOPHEN 160 MG/5ML PO SOLN: 325.0000 mg | ORAL | Status: DC | PRN

## 2015-11-08 MED ORDER — LACTATED RINGERS IV SOLN
INTRAVENOUS | Status: DC
  Administered 2015-11-08 (×2): via INTRAVENOUS

## 2015-11-08 MED ORDER — CEFAZOLIN SODIUM-DEXTROSE 2-3 GM-% IV SOLR
2.0000 g | Freq: Once | INTRAVENOUS | Status: AC
  Administered 2015-11-08: 2 g via INTRAVENOUS

## 2015-11-08 MED ORDER — ROCURONIUM BROMIDE 100 MG/10ML IV SOLN
INTRAVENOUS | Status: DC | PRN
Start: 2015-11-08 — End: 2015-11-08
  Administered 2015-11-08: 25 mg via INTRAVENOUS

## 2015-11-08 MED ORDER — OXYCODONE HCL 5 MG/5ML PO SOLN: 5.0000 mg | Freq: Once | ORAL | Status: DC | PRN

## 2015-11-08 MED ORDER — FENTANYL CITRATE (PF) 100 MCG/2ML IJ SOLN
INTRAMUSCULAR | Status: DC | PRN
  Administered 2015-11-08: 100 ug via INTRAVENOUS

## 2015-11-08 MED ORDER — LACTATED RINGERS IV SOLN: 500.0000 mL | INTRAVENOUS | Status: DC

## 2015-11-08 MED ORDER — PROPOFOL 10 MG/ML IV BOLUS
INTRAVENOUS | Status: DC | PRN
  Administered 2015-11-08: 50 mg via INTRAVENOUS
  Administered 2015-11-08: 150 mg via INTRAVENOUS

## 2015-11-08 MED ORDER — DEXAMETHASONE SODIUM PHOSPHATE 4 MG/ML IJ SOLN
INTRAMUSCULAR | Status: DC | PRN
  Administered 2015-11-08: 10 mg via INTRAVENOUS

## 2015-11-08 MED ORDER — ONDANSETRON HCL 4 MG/2ML IJ SOLN: 4.0000 mg | Freq: Once | INTRAMUSCULAR | Status: DC | PRN

## 2015-11-08 MED ORDER — MIDAZOLAM HCL 5 MG/5ML IJ SOLN
INTRAMUSCULAR | Status: DC | PRN
  Administered 2015-11-08: 2 mg via INTRAVENOUS

## 2015-11-08 MED ORDER — SCOPOLAMINE 1 MG/3DAYS TD PT72
1.0000 | MEDICATED_PATCH | TRANSDERMAL | Status: DC
  Administered 2015-11-08: 1.5 mg via TRANSDERMAL

## 2015-11-08 MED ORDER — HYDROMORPHONE HCL 1 MG/ML IJ SOLN: 0.2500 mg | INTRAMUSCULAR | Status: DC | PRN

## 2015-11-08 MED ORDER — LIDOCAINE HCL 1 % IJ SOLN
INTRAMUSCULAR | Status: DC | PRN
  Administered 2015-11-08: 30 mL via NASAL

## 2015-11-08 MED ORDER — ONDANSETRON HCL 4 MG/2ML IJ SOLN
INTRAMUSCULAR | Status: DC | PRN
  Administered 2015-11-08: 4 mg via INTRAVENOUS

## 2015-11-08 MED ORDER — ACETAMINOPHEN 325 MG PO TABS: 325.0000 mg | ORAL_TABLET | ORAL | Status: DC | PRN

## 2015-11-08 MED ORDER — GLYCOPYRROLATE 0.2 MG/ML IJ SOLN
INTRAMUSCULAR | Status: DC | PRN
  Administered 2015-11-08: 0.1 mg via INTRAVENOUS

## 2015-11-08 MED ORDER — LIDOCAINE-EPINEPHRINE 1 %-1:100000 IJ SOLN
INTRAMUSCULAR | Status: DC | PRN
Start: 2015-11-08 — End: 2015-11-08
  Administered 2015-11-08: 2.5 mL

## 2015-11-08 MED ORDER — LIDOCAINE HCL (CARDIAC) 20 MG/ML IV SOLN
INTRAVENOUS | Status: DC | PRN
  Administered 2015-11-08: 50 mg via INTRAVENOUS

## 2015-11-08 MED ORDER — ACETAMINOPHEN 10 MG/ML IV SOLN
1000.0000 mg | Freq: Once | INTRAVENOUS | Status: AC
  Administered 2015-11-08: 1000 mg via INTRAVENOUS

## 2015-11-08 MED ORDER — OXYMETAZOLINE HCL 0.05 % NA SOLN
2.0000 | Freq: Once | NASAL | Status: AC
  Administered 2015-11-08: 2 via NASAL

## 2015-11-08 MED ORDER — OXYCODONE HCL 5 MG PO TABS: 5.0000 mg | ORAL_TABLET | Freq: Once | ORAL | Status: DC | PRN

## 2015-11-08 SURGICAL SUPPLY — 34 items
CANISTER SUCT 1200ML W/VALVE (MISCELLANEOUS) ×2 IMPLANT
CATH IV 18X1 1/4 SAFELET (CATHETERS) ×2 IMPLANT
COAG SUCT 10F 3.5MM HAND CTRL (MISCELLANEOUS) ×1 IMPLANT
COAGULATOR SUCT 8FR VV (MISCELLANEOUS) IMPLANT
DEVICE INFLATION 20/61 (MISCELLANEOUS) IMPLANT
DRAPE HEAD BAR (DRAPES) ×2 IMPLANT
DRESSING NASL FOAM PST OP SINU (MISCELLANEOUS) IMPLANT
DRSG NASAL 4CM NASOPORE (MISCELLANEOUS) IMPLANT
DRSG NASAL FOAM POST OP SINU (MISCELLANEOUS)
GLOVE PI ULTRA LF STRL 7.5 (GLOVE) ×2 IMPLANT
GLOVE PI ULTRA NON LATEX 7.5 (GLOVE) ×2
IRRIGATOR 4MM STR (IRRIGATION / IRRIGATOR) ×1 IMPLANT
IV CATH 18X1 1/4 SAFELET (CATHETERS) ×1
IV NS 500ML (IV SOLUTION)
IV NS 500ML BAXH (IV SOLUTION) ×1 IMPLANT
KIT ROOM TURNOVER OR (KITS) ×2 IMPLANT
NDL HYPO 25GX1X1/2 BEV (NEEDLE) ×1 IMPLANT
NDL SPNL 25GX3.5 QUINCKE BL (NEEDLE) IMPLANT
NEEDLE HYPO 25GX1X1/2 BEV (NEEDLE) ×2 IMPLANT
NEEDLE SPNL 25GX3.5 QUINCKE BL (NEEDLE) IMPLANT
NS IRRIG 500ML POUR BTL (IV SOLUTION) ×2 IMPLANT
PACK DRAPE NASAL/ENT (PACKS) ×2 IMPLANT
PACKING NASAL EPIS 4X2.4 XEROG (MISCELLANEOUS) ×1 IMPLANT
PAD GROUND ADULT SPLIT (MISCELLANEOUS) ×2 IMPLANT
PATTIES SURGICAL .5 X3 (DISPOSABLE) ×2 IMPLANT
SET HANDPIECE IRR DIEGO (MISCELLANEOUS) ×2 IMPLANT
SINUPLASTY BALLN CATHTIP (CATHETERS) IMPLANT
SOL ANTI-FOG 6CC FOG-OUT (MISCELLANEOUS) ×1 IMPLANT
SOL FOG-OUT ANTI-FOG 6CC (MISCELLANEOUS) ×1
STRAP BODY AND KNEE 60X3 (MISCELLANEOUS) ×2 IMPLANT
SYR 3ML LL SCALE MARK (SYRINGE) ×2 IMPLANT
SYSTEM BALLN SINUPLASTY 6X16 (BALLOONS) IMPLANT
TOWEL OR 17X26 4PK STRL BLUE (TOWEL DISPOSABLE) ×2 IMPLANT
WATER STERILE IRR 500ML POUR (IV SOLUTION) IMPLANT

## 2015-11-08 NOTE — Op Note (Signed)
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 Straight nasal septum  Procedure: The patient was given general anesthesia by oral endotracheal intubation. The nose was prepped using 2-1/2 mL of 1% Xylocaine with epi 1:100,000 for infiltration of the middle and inferior turbinates. Cottonoid pledgets soaked in phenylephrine and Xylocaine were then placed into the nasal passages on both sides. She was prepped and draped sterile fashion.  Cotton pledges were removed and the 0 scope was used to visualize the nasal airway on both sides. The very large conchal bullosa were filling the upper airways and the large inferior turbinates were filling the lower airways. The left side was addressed first with trimming some of the inferior portion of the inferior turbinate. Electrocautery was used to help control bleeding. The remaining turbinate was outfractured to give more visualization of the lower airway. Gruenwald forceps were used to trim along the anterior border of the middle turbinate to open up the large conchal bullosa. The lateral wall of the conchal bullosa was removed and some of the inferior portion was trimmed as well to round it off. Electrocautery was used along its trimmed edge to help control bleeding. A cottonoid pledget was placed here temporarily.  The right side was then visualized and again the inferior turbinate was trimmed in a similar fashion as noted above. The 0 scope was used also for visualizing this  area and trimming the middle turbinate. The lateral wall and some of the inferior border of the turbinate were trimmed and removed and electrocautery used along its border to control bleeding. Xerogel was then placed along the trimmed middle turbinate on both sides to help what the area and prevent further bleeding.  The airways were now visualized 0 scope and inferiorly there were opened much larger on both sides. The septum was straight and in the midline. She had good open airways.  The patient was awakened and taken to the recovery room in satisfactory condition. There were no operative complications.  Dispo:   To PACU to then be discharged home  Plan:  To follow-up in the office in 6 days. She will rest at home. Start saline flushes tomorrow. She may sniff but should not blow her nose.  Desirae Mancusi H  11/08/2015 10:56 AM

## 2015-11-08 NOTE — Transfer of Care (Signed)
Immediate Anesthesia Transfer of Care Note  Patient: Janet Webb  Procedure(s) Performed: Procedure(s): ENDOSCOPIC CONCHA BULLOSA REDUCTION MIDDLE TURBINATES (Bilateral) TURBINATE REDUCTION (Bilateral)  Patient Location: PACU  Anesthesia Type: General  Level of Consciousness: awake, alert  and patient cooperative  Airway and Oxygen Therapy: Patient Spontanous Breathing and Patient connected to supplemental oxygen  Post-op Assessment: Post-op Vital signs reviewed, Patient's Cardiovascular Status Stable, Respiratory Function Stable, Patent Airway and No signs of Nausea or vomiting  Post-op Vital Signs: Reviewed and stable  Complications: No apparent anesthesia complications

## 2015-11-08 NOTE — H&P (Signed)
  H&P has been reviewed and no changes necessary. To be downloaded later. 

## 2015-11-08 NOTE — Anesthesia Preprocedure Evaluation (Signed)
Anesthesia Evaluation  Patient identified by MRN, date of birth, ID band Patient awake    Reviewed: Allergy & Precautions, H&P , NPO status , Patient's Chart, lab work & pertinent test results, reviewed documented beta blocker date and time   History of Anesthesia Complications (+) PONV, Family history of anesthesia reaction and history of anesthetic complications  Airway Mallampati: II  TM Distance: >3 FB Neck ROM: full    Dental no notable dental hx.    Pulmonary neg pulmonary ROS,    Pulmonary exam normal breath sounds clear to auscultation       Cardiovascular Exercise Tolerance: Good hypertension,  Rhythm:regular Rate:Normal     Neuro/Psych negative neurological ROS  negative psych ROS   GI/Hepatic negative GI ROS, Neg liver ROS,   Endo/Other  negative endocrine ROS  Renal/GU Renal diseaseH/o Kidney stone  negative genitourinary   Musculoskeletal   Abdominal   Peds  Hematology negative hematology ROS (+)   Anesthesia Other Findings   Reproductive/Obstetrics negative OB ROS                             Anesthesia Physical Anesthesia Plan  ASA: II  Anesthesia Plan: General   Post-op Pain Management:    Induction:   Airway Management Planned:   Additional Equipment:   Intra-op Plan:   Post-operative Plan:   Informed Consent: I have reviewed the patients History and Physical, chart, labs and discussed the procedure including the risks, benefits and alternatives for the proposed anesthesia with the patient or authorized representative who has indicated his/her understanding and acceptance.   Dental Advisory Given  Plan Discussed with: CRNA  Anesthesia Plan Comments:         Anesthesia Quick Evaluation

## 2015-11-08 NOTE — Anesthesia Procedure Notes (Signed)
Procedure Name: Intubation Date/Time: 11/08/2015 10:04 AM Performed by: Mayme Genta Pre-anesthesia Checklist: Patient identified, Emergency Drugs available, Suction available, Patient being monitored and Timeout performed Patient Re-evaluated:Patient Re-evaluated prior to inductionOxygen Delivery Method: Circle system utilized Preoxygenation: Pre-oxygenation with 100% oxygen Intubation Type: IV induction Ventilation: Mask ventilation without difficulty Laryngoscope Size: Miller and 3 Grade View: Grade II Tube type: Oral Rae Tube size: 7.0 mm Number of attempts: 2 Placement Confirmation: ETT inserted through vocal cords under direct vision,  positive ETCO2 and breath sounds checked- equal and bilateral Tube secured with: Tape Dental Injury: Teeth and Oropharynx as per pre-operative assessment

## 2015-11-08 NOTE — Anesthesia Postprocedure Evaluation (Signed)
  Anesthesia Post-op Note  Patient: Janet Webb  Procedure(s) Performed: Procedure(s): ENDOSCOPIC CONCHA BULLOSA REDUCTION MIDDLE TURBINATES (Bilateral) TURBINATE REDUCTION (Bilateral)  Anesthesia type:General  Patient location: PACU  Post pain: Pain level controlled  Post assessment: Post-op Vital signs reviewed, Patient's Cardiovascular Status Stable, Respiratory Function Stable, Patent Airway and No signs of Nausea or vomiting  Post vital signs: Reviewed and stable  Last Vitals:  Filed Vitals:   11/08/15 1115  BP:   Pulse: 75  Temp:   Resp: 17    Level of consciousness: awake, alert  and patient cooperative  Complications: No apparent anesthesia complications

## 2015-11-09 ENCOUNTER — Encounter: Payer: Self-pay | Admitting: Otolaryngology

## 2017-06-01 ENCOUNTER — Ambulatory Visit: Payer: Self-pay | Admitting: Family Medicine

## 2019-12-21 ENCOUNTER — Other Ambulatory Visit: Payer: Self-pay | Admitting: Family Medicine

## 2019-12-21 DIAGNOSIS — J329 Chronic sinusitis, unspecified: Secondary | ICD-10-CM

## 2020-01-02 ENCOUNTER — Other Ambulatory Visit: Payer: BLUE CROSS/BLUE SHIELD

## 2020-01-13 ENCOUNTER — Ambulatory Visit
Admission: RE | Admit: 2020-01-13 | Discharge: 2020-01-13 | Disposition: A | Discharge status: Home / Self Care | Payer: BC Managed Care – PPO | Source: Ambulatory Visit | Attending: Family Medicine | Admitting: Family Medicine

## 2020-01-13 DIAGNOSIS — J329 Chronic sinusitis, unspecified: Secondary | ICD-10-CM

## 2020-10-12 ENCOUNTER — Other Ambulatory Visit: Payer: Self-pay

## 2020-10-12 ENCOUNTER — Emergency Department (HOSPITAL_COMMUNITY): Payer: BC Managed Care – PPO | Admitting: Certified Registered Nurse Anesthetist

## 2020-10-12 ENCOUNTER — Emergency Department (HOSPITAL_BASED_OUTPATIENT_CLINIC_OR_DEPARTMENT_OTHER): Payer: BC Managed Care – PPO

## 2020-10-12 ENCOUNTER — Inpatient Hospital Stay: Admit: 2020-10-12 | Payer: BC Managed Care – PPO | Admitting: Surgery

## 2020-10-12 ENCOUNTER — Encounter (HOSPITAL_BASED_OUTPATIENT_CLINIC_OR_DEPARTMENT_OTHER): Payer: Self-pay | Admitting: Emergency Medicine

## 2020-10-12 ENCOUNTER — Encounter (HOSPITAL_COMMUNITY): Admission: EM | Disposition: A | Discharge status: Home / Self Care | Payer: Self-pay | Source: Home / Self Care

## 2020-10-12 ENCOUNTER — Inpatient Hospital Stay (HOSPITAL_BASED_OUTPATIENT_CLINIC_OR_DEPARTMENT_OTHER)
Admission: EM | Admit: 2020-10-12 | Discharge: 2020-10-16 | DRG: 419 | Disposition: A | Discharge status: Home / Self Care | Payer: BC Managed Care – PPO | Attending: General Surgery | Admitting: General Surgery

## 2020-10-12 DIAGNOSIS — E876 Hypokalemia: Secondary | ICD-10-CM | POA: Diagnosis present

## 2020-10-12 DIAGNOSIS — R059 Cough, unspecified: Secondary | ICD-10-CM

## 2020-10-12 DIAGNOSIS — Z87442 Personal history of urinary calculi: Secondary | ICD-10-CM | POA: Diagnosis not present

## 2020-10-12 DIAGNOSIS — Z79899 Other long term (current) drug therapy: Secondary | ICD-10-CM | POA: Diagnosis not present

## 2020-10-12 DIAGNOSIS — R1010 Upper abdominal pain, unspecified: Secondary | ICD-10-CM | POA: Diagnosis not present

## 2020-10-12 DIAGNOSIS — J329 Chronic sinusitis, unspecified: Secondary | ICD-10-CM | POA: Diagnosis present

## 2020-10-12 DIAGNOSIS — Z20822 Contact with and (suspected) exposure to covid-19: Secondary | ICD-10-CM | POA: Diagnosis not present

## 2020-10-12 DIAGNOSIS — E785 Hyperlipidemia, unspecified: Secondary | ICD-10-CM | POA: Diagnosis present

## 2020-10-12 DIAGNOSIS — K8000 Calculus of gallbladder with acute cholecystitis without obstruction: Secondary | ICD-10-CM | POA: Diagnosis not present

## 2020-10-12 DIAGNOSIS — Z885 Allergy status to narcotic agent status: Secondary | ICD-10-CM

## 2020-10-12 DIAGNOSIS — I10 Essential (primary) hypertension: Secondary | ICD-10-CM | POA: Diagnosis present

## 2020-10-12 DIAGNOSIS — K810 Acute cholecystitis: Secondary | ICD-10-CM | POA: Diagnosis present

## 2020-10-12 DIAGNOSIS — K819 Cholecystitis, unspecified: Secondary | ICD-10-CM

## 2020-10-12 DIAGNOSIS — T17908A Unspecified foreign body in respiratory tract, part unspecified causing other injury, initial encounter: Secondary | ICD-10-CM

## 2020-10-12 DIAGNOSIS — Z791 Long term (current) use of non-steroidal anti-inflammatories (NSAID): Secondary | ICD-10-CM | POA: Diagnosis not present

## 2020-10-12 DIAGNOSIS — R7989 Other specified abnormal findings of blood chemistry: Secondary | ICD-10-CM

## 2020-10-12 HISTORY — PX: CHOLECYSTECTOMY: SHX55

## 2020-10-12 LAB — CBC
HCT: 39.5 % (ref 36.0–46.0)
Hemoglobin: 12.9 g/dL (ref 12.0–15.0)
MCH: 28.4 pg (ref 26.0–34.0)
MCHC: 32.7 g/dL (ref 30.0–36.0)
MCV: 87 fL (ref 80.0–100.0)
Platelets: 305 10*3/uL (ref 150–400)
RBC: 4.54 MIL/uL (ref 3.87–5.11)
RDW: 13.3 % (ref 11.5–15.5)
WBC: 11 10*3/uL — ABNORMAL HIGH (ref 4.0–10.5)
nRBC: 0 % (ref 0.0–0.2)

## 2020-10-12 LAB — COMPREHENSIVE METABOLIC PANEL
ALT: 30 U/L (ref 0–44)
AST: 30 U/L (ref 15–41)
Albumin: 3.9 g/dL (ref 3.5–5.0)
Alkaline Phosphatase: 44 U/L (ref 38–126)
Anion gap: 9 (ref 5–15)
BUN: 13 mg/dL (ref 6–20)
CO2: 26 mmol/L (ref 22–32)
Calcium: 8.6 mg/dL — ABNORMAL LOW (ref 8.9–10.3)
Chloride: 98 mmol/L (ref 98–111)
Creatinine, Ser: 0.91 mg/dL (ref 0.44–1.00)
GFR, Estimated: >60 mL/min (ref >60)
Glucose, Bld: 148 mg/dL — ABNORMAL HIGH (ref 70–99)
Potassium: 2.8 mmol/L — ABNORMAL LOW (ref 3.5–5.1)
Sodium: 133 mmol/L — ABNORMAL LOW (ref 135–145)
Total Bilirubin: 0.5 mg/dL (ref 0.3–1.2)
Total Protein: 7.1 g/dL (ref 6.5–8.1)

## 2020-10-12 LAB — RESPIRATORY PANEL BY RT PCR (FLU A&B, COVID)
Influenza A by PCR: NEGATIVE
Influenza B by PCR: NEGATIVE
SARS Coronavirus 2 by RT PCR: NEGATIVE

## 2020-10-12 LAB — URINALYSIS, ROUTINE W REFLEX MICROSCOPIC
Bilirubin Urine: NEGATIVE
Glucose, UA: NEGATIVE mg/dL
Hgb urine dipstick: NEGATIVE
Ketones, ur: NEGATIVE mg/dL
Leukocytes,Ua: NEGATIVE
Nitrite: NEGATIVE
Protein, ur: NEGATIVE mg/dL
Specific Gravity, Urine: 1.02 (ref 1.005–1.030)
pH: 8 (ref 5.0–8.0)

## 2020-10-12 LAB — LIPASE, BLOOD: Lipase: 37 U/L (ref 11–51)

## 2020-10-12 LAB — PREGNANCY, URINE: Preg Test, Ur: NEGATIVE

## 2020-10-12 SURGERY — LAPAROSCOPIC CHOLECYSTECTOMY WITH INTRAOPERATIVE CHOLANGIOGRAM
Anesthesia: General | Site: Abdomen

## 2020-10-12 MED ORDER — SUGAMMADEX SODIUM 200 MG/2ML IV SOLN
INTRAVENOUS | Status: DC | PRN
  Administered 2020-10-12: 200 mg via INTRAVENOUS

## 2020-10-12 MED ORDER — MONTELUKAST SODIUM 10 MG PO TABS
10.0000 mg | ORAL_TABLET | Freq: Every day | ORAL | Status: DC
  Administered 2020-10-12 – 2020-10-15 (×4): 10 mg via ORAL
  Filled 2020-10-12 (×4): qty 1

## 2020-10-12 MED ORDER — LISINOPRIL 10 MG PO TABS
10.0000 mg | ORAL_TABLET | Freq: Every day | ORAL | Status: DC
  Administered 2020-10-14 – 2020-10-16 (×2): 10 mg via ORAL
  Filled 2020-10-12 (×4): qty 1

## 2020-10-12 MED ORDER — METOPROLOL TARTRATE 5 MG/5ML IV SOLN: 5.0000 mg | Freq: Four times a day (QID) | INTRAVENOUS | Status: DC | PRN

## 2020-10-12 MED ORDER — HYDROCHLOROTHIAZIDE 12.5 MG PO CAPS
12.5000 mg | ORAL_CAPSULE | Freq: Every day | ORAL | Status: DC
  Administered 2020-10-13 – 2020-10-16 (×3): 12.5 mg via ORAL
  Filled 2020-10-12 (×4): qty 1

## 2020-10-12 MED ORDER — CHLORHEXIDINE GLUCONATE 0.12 % MT SOLN
15.0000 mL | OROMUCOSAL | Status: AC
  Administered 2020-10-12: 15 mL via OROMUCOSAL

## 2020-10-12 MED ORDER — FENTANYL CITRATE (PF) 100 MCG/2ML IJ SOLN
100.0000 ug | Freq: Once | INTRAMUSCULAR | Status: AC
  Administered 2020-10-12: 100 ug via INTRAVENOUS
  Filled 2020-10-12: qty 2

## 2020-10-12 MED ORDER — ONDANSETRON HCL 4 MG/2ML IJ SOLN
4.0000 mg | Freq: Once | INTRAMUSCULAR | Status: AC
  Administered 2020-10-12: 4 mg via INTRAVENOUS
  Filled 2020-10-12: qty 2

## 2020-10-12 MED ORDER — SCOPOLAMINE 1 MG/3DAYS TD PT72
MEDICATED_PATCH | TRANSDERMAL | Status: DC | PRN
  Administered 2020-10-12: 1 via TRANSDERMAL

## 2020-10-12 MED ORDER — OXYCODONE HCL 5 MG PO TABS: 5.0000 mg | ORAL_TABLET | ORAL | Status: DC | PRN

## 2020-10-12 MED ORDER — ENOXAPARIN SODIUM 40 MG/0.4ML ~~LOC~~ SOLN
40.0000 mg | SUBCUTANEOUS | Status: DC
  Administered 2020-10-13 – 2020-10-16 (×3): 40 mg via SUBCUTANEOUS
  Filled 2020-10-12 (×4): qty 0.4

## 2020-10-12 MED ORDER — MORPHINE SULFATE (PF) 4 MG/ML IV SOLN: 2.0000 mg | INTRAVENOUS | Status: DC | PRN

## 2020-10-12 MED ORDER — ONDANSETRON 4 MG PO TBDP: 4.0000 mg | ORAL_TABLET | Freq: Four times a day (QID) | ORAL | Status: DC | PRN

## 2020-10-12 MED ORDER — FENTANYL CITRATE (PF) 100 MCG/2ML IJ SOLN
INTRAMUSCULAR | Status: DC | PRN
  Administered 2020-10-12: 100 ug via INTRAVENOUS
  Administered 2020-10-12: 50 ug via INTRAVENOUS

## 2020-10-12 MED ORDER — ACETAMINOPHEN 650 MG RE SUPP: 650.0000 mg | Freq: Four times a day (QID) | RECTAL | Status: DC | PRN

## 2020-10-12 MED ORDER — METRONIDAZOLE IN NACL 5-0.79 MG/ML-% IV SOLN
INTRAVENOUS | Status: AC
  Filled 2020-10-12: qty 100

## 2020-10-12 MED ORDER — POTASSIUM CHLORIDE CRYS ER 20 MEQ PO TBCR
40.0000 meq | EXTENDED_RELEASE_TABLET | Freq: Once | ORAL | Status: AC
  Administered 2020-10-12: 40 meq via ORAL
  Filled 2020-10-12: qty 2

## 2020-10-12 MED ORDER — FENTANYL CITRATE (PF) 100 MCG/2ML IJ SOLN
50.0000 ug | Freq: Once | INTRAMUSCULAR | Status: AC
  Administered 2020-10-12: 50 ug via INTRAVENOUS
  Filled 2020-10-12: qty 2

## 2020-10-12 MED ORDER — FENTANYL CITRATE (PF) 100 MCG/2ML IJ SOLN
INTRAMUSCULAR | Status: AC
  Filled 2020-10-12: qty 2

## 2020-10-12 MED ORDER — PROPOFOL 10 MG/ML IV BOLUS
INTRAVENOUS | Status: DC | PRN
  Administered 2020-10-12: 120 mg via INTRAVENOUS

## 2020-10-12 MED ORDER — BUPIVACAINE HCL (PF) 0.25 % IJ SOLN
INTRAMUSCULAR | Status: AC
  Filled 2020-10-12: qty 30

## 2020-10-12 MED ORDER — PRAVASTATIN SODIUM 40 MG PO TABS
40.0000 mg | ORAL_TABLET | Freq: Every day | ORAL | Status: DC
  Administered 2020-10-13 – 2020-10-16 (×3): 40 mg via ORAL
  Filled 2020-10-12 (×2): qty 2
  Filled 2020-10-12 (×2): qty 1
  Filled 2020-10-12: qty 2
  Filled 2020-10-12 (×2): qty 1
  Filled 2020-10-12: qty 2

## 2020-10-12 MED ORDER — DEXAMETHASONE SODIUM PHOSPHATE 4 MG/ML IJ SOLN
INTRAMUSCULAR | Status: DC | PRN
  Administered 2020-10-12: 10 mg via INTRAVENOUS

## 2020-10-12 MED ORDER — LIDOCAINE 2% (20 MG/ML) 5 ML SYRINGE
INTRAMUSCULAR | Status: DC | PRN
  Administered 2020-10-12: 80 mg via INTRAVENOUS

## 2020-10-12 MED ORDER — SIMETHICONE 80 MG PO CHEW
40.0000 mg | CHEWABLE_TABLET | Freq: Four times a day (QID) | ORAL | Status: DC | PRN
  Administered 2020-10-13: 40 mg via ORAL
  Filled 2020-10-12: qty 1

## 2020-10-12 MED ORDER — ROCURONIUM BROMIDE 10 MG/ML (PF) SYRINGE
PREFILLED_SYRINGE | INTRAVENOUS | Status: DC | PRN
  Administered 2020-10-12: 80 mg via INTRAVENOUS

## 2020-10-12 MED ORDER — 0.9 % SODIUM CHLORIDE (POUR BTL) OPTIME
TOPICAL | Status: DC | PRN
  Administered 2020-10-12: 1000 mL

## 2020-10-12 MED ORDER — SODIUM CHLORIDE 0.9 % IV BOLUS
1000.0000 mL | Freq: Once | INTRAVENOUS | Status: AC
  Administered 2020-10-12: 1000 mL via INTRAVENOUS

## 2020-10-12 MED ORDER — METRONIDAZOLE IN NACL 5-0.79 MG/ML-% IV SOLN
INTRAVENOUS | Status: DC | PRN
  Administered 2020-10-12: 500 mg via INTRAVENOUS

## 2020-10-12 MED ORDER — KCL IN DEXTROSE-NACL 20-5-0.45 MEQ/L-%-% IV SOLN
INTRAVENOUS | Status: DC
  Filled 2020-10-12 (×2): qty 1000

## 2020-10-12 MED ORDER — METHOCARBAMOL 500 MG PO TABS
750.0000 mg | ORAL_TABLET | Freq: Two times a day (BID) | ORAL | Status: DC
  Administered 2020-10-12 – 2020-10-16 (×7): 750 mg via ORAL
  Filled 2020-10-12 (×8): qty 2

## 2020-10-12 MED ORDER — MIDAZOLAM HCL 5 MG/5ML IJ SOLN
INTRAMUSCULAR | Status: DC | PRN
  Administered 2020-10-12: 2 mg via INTRAVENOUS

## 2020-10-12 MED ORDER — SCOPOLAMINE 1 MG/3DAYS TD PT72
MEDICATED_PATCH | TRANSDERMAL | Status: AC
  Filled 2020-10-12: qty 1

## 2020-10-12 MED ORDER — ACETAMINOPHEN 325 MG PO TABS
650.0000 mg | ORAL_TABLET | Freq: Four times a day (QID) | ORAL | Status: DC | PRN
  Administered 2020-10-14: 650 mg via ORAL
  Filled 2020-10-12: qty 2

## 2020-10-12 MED ORDER — LISINOPRIL-HYDROCHLOROTHIAZIDE 10-12.5 MG PO TABS: 1.0000 | ORAL_TABLET | Freq: Every day | ORAL | Status: DC

## 2020-10-12 MED ORDER — LACTATED RINGERS IV SOLN
INTRAVENOUS | Status: DC | PRN
  Administered 2020-10-12: 1000 mL

## 2020-10-12 MED ORDER — LACTATED RINGERS IV SOLN: INTRAVENOUS | Status: DC

## 2020-10-12 MED ORDER — CEFAZOLIN SODIUM-DEXTROSE 2-4 GM/100ML-% IV SOLN
INTRAVENOUS | Status: AC
  Filled 2020-10-12: qty 100

## 2020-10-12 MED ORDER — FENTANYL CITRATE (PF) 100 MCG/2ML IJ SOLN
50.0000 ug | Freq: Once | INTRAMUSCULAR | Status: AC
  Administered 2020-10-12: 50 ug via INTRAVENOUS

## 2020-10-12 MED ORDER — CEFAZOLIN SODIUM-DEXTROSE 2-3 GM-%(50ML) IV SOLR
INTRAVENOUS | Status: DC | PRN
  Administered 2020-10-12: 2 g via INTRAVENOUS

## 2020-10-12 MED ORDER — FLUTICASONE PROPIONATE 50 MCG/ACT NA SUSP
1.0000 | Freq: Every day | NASAL | Status: DC
  Administered 2020-10-14 – 2020-10-15 (×2): 1 via NASAL
  Filled 2020-10-12: qty 16

## 2020-10-12 MED ORDER — KETOROLAC TROMETHAMINE 30 MG/ML IJ SOLN: 30.0000 mg | Freq: Four times a day (QID) | INTRAMUSCULAR | Status: DC | PRN

## 2020-10-12 MED ORDER — BUPIVACAINE-EPINEPHRINE 0.25% -1:200000 IJ SOLN
INTRAMUSCULAR | Status: DC | PRN
  Administered 2020-10-12: 27 mL

## 2020-10-12 MED ORDER — ALUM & MAG HYDROXIDE-SIMETH 200-200-20 MG/5ML PO SUSP
30.0000 mL | ORAL | Status: DC | PRN
  Administered 2020-10-12 – 2020-10-14 (×2): 30 mL via ORAL
  Filled 2020-10-12 (×2): qty 30

## 2020-10-12 MED ORDER — ONDANSETRON HCL 4 MG/2ML IJ SOLN
INTRAMUSCULAR | Status: DC | PRN
  Administered 2020-10-12: 4 mg via INTRAVENOUS

## 2020-10-12 MED ORDER — DOCUSATE SODIUM 100 MG PO CAPS
100.0000 mg | ORAL_CAPSULE | Freq: Two times a day (BID) | ORAL | Status: DC
  Administered 2020-10-12 – 2020-10-16 (×7): 100 mg via ORAL
  Filled 2020-10-12 (×8): qty 1

## 2020-10-12 MED ORDER — ONDANSETRON HCL 4 MG/2ML IJ SOLN: 4.0000 mg | Freq: Four times a day (QID) | INTRAMUSCULAR | Status: DC | PRN

## 2020-10-12 SURGICAL SUPPLY — 55 items
ADH SKN CLS APL DERMABOND .7 (GAUZE/BANDAGES/DRESSINGS) ×1
APL PRP STRL LF DISP 70% ISPRP (MISCELLANEOUS) ×1
APPLIER CLIP 5 13 M/L LIGAMAX5 (MISCELLANEOUS) ×2
APR CLP MED LRG 5 ANG JAW (MISCELLANEOUS) ×1
BAG SPEC RTRVL LRG 6X4 10 (ENDOMECHANICALS) ×1
BLADE SURG 15 STRL LF DISP TIS (BLADE) IMPLANT
BLADE SURG 15 STRL SS (BLADE) ×2
BLADE SURG SZ11 CARB STEEL (BLADE) ×1 IMPLANT
CABLE HIGH FREQUENCY MONO STRZ (ELECTRODE) ×1 IMPLANT
CHLORAPREP W/TINT 26 (MISCELLANEOUS) ×2 IMPLANT
CLIP APPLIE 5 13 M/L LIGAMAX5 (MISCELLANEOUS) ×1 IMPLANT
COVER MAYO STAND STRL (DRAPES) IMPLANT
COVER SURGICAL LIGHT HANDLE (MISCELLANEOUS) ×2 IMPLANT
COVER WAND RF STERILE (DRAPES) IMPLANT
DECANTER SPIKE VIAL GLASS SM (MISCELLANEOUS) ×2 IMPLANT
DERMABOND ADVANCED (GAUZE/BANDAGES/DRESSINGS) ×1
DERMABOND ADVANCED .7 DNX12 (GAUZE/BANDAGES/DRESSINGS) ×1 IMPLANT
DRAPE C-ARM 42X120 X-RAY (DRAPES) IMPLANT
ELECT PENCIL ROCKER SW 15FT (MISCELLANEOUS) ×2 IMPLANT
ELECT REM PT RETURN 15FT ADLT (MISCELLANEOUS) ×2 IMPLANT
ENDOLOOP SUT PDS II  0 18 (SUTURE) ×2
ENDOLOOP SUT PDS II 0 18 (SUTURE) IMPLANT
GAUZE 4X4 16PLY RFD (DISPOSABLE) ×1 IMPLANT
GLOVE BIOGEL PI IND STRL 6 (GLOVE) ×1 IMPLANT
GLOVE BIOGEL PI INDICATOR 6 (GLOVE) ×1
GLOVE SS PI  5.5 STRL (GLOVE) ×2
GLOVE SS PI 5.5 STRL (GLOVE) ×1 IMPLANT
GOWN STRL REUS W/TWL LRG LVL3 (GOWN DISPOSABLE) ×2 IMPLANT
GOWN STRL REUS W/TWL XL LVL3 (GOWN DISPOSABLE) ×4 IMPLANT
GRASPER SUT TROCAR 14GX15 (MISCELLANEOUS) IMPLANT
HEMOSTAT SNOW SURGICEL 2X4 (HEMOSTASIS) IMPLANT
KIT BASIN OR (CUSTOM PROCEDURE TRAY) ×2 IMPLANT
KIT TURNOVER KIT A (KITS) IMPLANT
L-HOOK LAP DISP 36CM (ELECTROSURGICAL) ×2
LHOOK LAP DISP 36CM (ELECTROSURGICAL) IMPLANT
NDL INSUFFLATION 14GA 120MM (NEEDLE) IMPLANT
NEEDLE HYPO 22GX1.5 SAFETY (NEEDLE) ×1 IMPLANT
NEEDLE INSUFFLATION 14GA 120MM (NEEDLE) IMPLANT
PACK UNIVERSAL I (CUSTOM PROCEDURE TRAY) ×1 IMPLANT
POUCH SPECIMEN RETRIEVAL 10MM (ENDOMECHANICALS) ×2 IMPLANT
SCISSORS LAP 5X35 DISP (ENDOMECHANICALS) ×2 IMPLANT
SET CHOLANGIOGRAPH MIX (MISCELLANEOUS) IMPLANT
SET IRRIG TUBING LAPAROSCOPIC (IRRIGATION / IRRIGATOR) ×2 IMPLANT
SET TUBE SMOKE EVAC HIGH FLOW (TUBING) ×1 IMPLANT
SLEEVE XCEL OPT CAN 5 100 (ENDOMECHANICALS) ×4 IMPLANT
SOL ANTI FOG 6CC (MISCELLANEOUS) IMPLANT
SOLUTION ANTI FOG 6CC (MISCELLANEOUS) ×1
SUT MNCRL AB 4-0 PS2 18 (SUTURE) ×2 IMPLANT
SUT VICRYL 0 UR6 27IN ABS (SUTURE) ×3 IMPLANT
SYR CONTROL 10ML LL (SYRINGE) ×1 IMPLANT
TOWEL OR 17X26 10 PK STRL BLUE (TOWEL DISPOSABLE) ×2 IMPLANT
TOWEL OR NON WOVEN STRL DISP B (DISPOSABLE) IMPLANT
TROCAR BLADELESS OPT 12M 100M (ENDOMECHANICALS) IMPLANT
TROCAR BLADELESS OPT 5 100 (ENDOMECHANICALS) ×2 IMPLANT
TROCAR XCEL BLUNT TIP 100MML (ENDOMECHANICALS) ×1 IMPLANT

## 2020-10-12 NOTE — Anesthesia Procedure Notes (Signed)
Procedure Name: Intubation Date/Time: 10/12/2020 1:50 PM Performed by: Claudia Desanctis, CRNA Pre-anesthesia Checklist: Patient identified, Emergency Drugs available, Suction available and Patient being monitored Patient Re-evaluated:Patient Re-evaluated prior to induction Oxygen Delivery Method: Circle system utilized Preoxygenation: Pre-oxygenation with 100% oxygen Induction Type: IV induction Ventilation: Mask ventilation without difficulty Laryngoscope Size: 2 and Miller Grade View: Grade I Tube type: Oral Tube size: 7.0 mm Number of attempts: 1 Airway Equipment and Method: Stylet Placement Confirmation: ETT inserted through vocal cords under direct vision,  positive ETCO2 and breath sounds checked- equal and bilateral Secured at: 21 cm Tube secured with: Tape Dental Injury: Teeth and Oropharynx as per pre-operative assessment  Comments: Pt with mod amt yellow emesis after induction, suctioned, head placed in t-berg position   Intubated   ett suctioned with no return of emesis, og placed and 200 ml emesis removed

## 2020-10-12 NOTE — ED Provider Notes (Signed)
[facility] EMERGENCY DEPARTMENT Provider Note   CSN: [id number] Arrival date & time: 10/12/20  0754     History Chief complaint abdominal pain  Janet Webb is a 49 y.o. female.  HPI   The patient states she has been having difficulty with intermittent abdominal pain for several months.  The symptoms would occur after eating certain foods however more recently it has been increasing with anything that she eats.  Usually the pain would resolve on its own but patient started having pain again last night that has not resolved.  The pain is in the upper abdomen.  It is moderate to severe in nature.  She denies any vomiting diarrhea.  She denies any fevers.  No urinary symptoms.  Patient has not had this evaluated until today.  Past Medical History:  Diagnosis Date  . Family history of adverse reaction to anesthesia    mother - severe PONV  . High cholesterol   . Hypertension   . Hypertrophy of nasal turbinates   . Kidney stones   . PONV (postoperative nausea and vomiting)   . Shoulder pain, right   . Sinusitis, chronic   . Vertigo    positional    There are no problems to display for this patient.   Past Surgical History:  Procedure Laterality Date  . ENDOSCOPIC CONCHA BULLOSA RESECTION Bilateral 11/08/2015   Procedure: ENDOSCOPIC CONCHA BULLOSA REDUCTION MIDDLE TURBINATES;  Surgeon: Margaretha Sheffield, MD;  Location: [facility];  Service: ENT;  Laterality: Bilateral;  . LITHOTRIPSY    . TURBINATE REDUCTION Bilateral 11/08/2015   Procedure: TURBINATE REDUCTION;  Surgeon: Margaretha Sheffield, MD;  Location: [facility];  Service: ENT;  Laterality: Bilateral;     OB History   No obstetric history on file.     No family history on file.  Social History   Tobacco Use  . Smoking status: Never Smoker  Substance Use Topics  . Alcohol use: Yes    Alcohol/week: 7.0 standard drinks    Types: 7 Glasses of wine per week    Comment:    . Drug use:  No    Home Medications Prior to Admission medications   Medication Sig Start Date End Date Taking? Authorizing Provider  ezetimibe (ZETIA) 10 MG tablet Take 10 mg by mouth daily.    [provider]  fluticasone (FLONASE) 50 MCG/ACT nasal spray Place 1 spray into both nostrils daily. AM    [provider]  HYDROCHLOROTHIAZIDE PO Take 12.5 mg by mouth daily. PM    [provider]  lisinopril-hydrochlorothiazide (PRINZIDE,ZESTORETIC) 10-12.5 MG per tablet Take 1 tablet by mouth daily.    [provider]  LOSARTAN POTASSIUM PO Take 25 mg by mouth daily. PM    [provider]  meloxicam (MOBIC) 7.5 MG tablet Take 1 tablet (7.5 mg total) by mouth daily. Take with food Patient taking differently: Take 7.5 mg by mouth daily as needed. Take with food 10/26/13   Lacretia Leigh, MD  methocarbamol (ROBAXIN) 750 MG tablet Take 1 tablet (750 mg total) by mouth 2 (two) times daily. 10/26/13   Lacretia Leigh, MD  montelukast (SINGULAIR) 10 MG tablet Take 10 mg by mouth at bedtime.    [provider]  pravastatin (PRAVACHOL) 40 MG tablet Take 40 mg by mouth daily.    [provider]    Allergies    Codeine, Percocet [oxycodone-acetaminophen], and Tramadol  Review of Systems   Review of Systems  All other  systems reviewed and are negative.   Physical Exam Updated Vital Signs BP (!) 146/89   Pulse 66   Temp 97.6 F (36.4 C)   Resp 20   SpO2 100%   Physical Exam Vitals and nursing note reviewed.  Constitutional:      General: She is not in acute distress.    Appearance: She is well-developed.  HENT:     Head: Normocephalic and atraumatic.     Right Ear: External ear normal.     Left Ear: External ear normal.  Eyes:     General: No scleral icterus.       Right eye: No discharge.        Left eye: No discharge.     Conjunctiva/sclera: Conjunctivae normal.  Neck:     Trachea: No tracheal deviation.  Cardiovascular:     Rate  and Rhythm: Normal rate and regular rhythm.  Pulmonary:     Effort: Pulmonary effort is normal. No respiratory distress.     Breath sounds: Normal breath sounds. No stridor. No wheezing or rales.  Abdominal:     General: Bowel sounds are normal. There is no distension.     Palpations: Abdomen is soft.     Tenderness: There is abdominal tenderness in the right upper quadrant and epigastric area. There is no guarding or rebound.  Musculoskeletal:        General: No tenderness.     Cervical back: Neck supple.  Skin:    General: Skin is warm and dry.     Findings: No rash.  Neurological:     Mental Status: She is alert.     Cranial Nerves: No cranial nerve deficit (no facial droop, extraocular movements intact, no slurred speech).     Sensory: No sensory deficit.     Motor: No abnormal muscle tone or seizure activity.     Coordination: Coordination normal.     ED Results / Procedures / Treatments   Labs (all labs ordered are listed, but only abnormal results are displayed) Labs Reviewed  CBC - Abnormal; Notable for the following components:      Result Value   WBC 11.0 (*)    All other components within normal limits  LIPASE, BLOOD  COMPREHENSIVE METABOLIC PANEL  URINALYSIS, ROUTINE W REFLEX MICROSCOPIC  PREGNANCY, URINE    EKG None  Radiology No results found.  Procedures Procedures (including critical care time)  Medications Ordered in ED Medications - No data to display  ED Course  I have reviewed the triage vital signs and the nursing notes.  Pertinent labs & imaging results that were available during my care of the patient were reviewed by me and considered in my medical decision making (see chart for details).  Clinical Course as of Oct 12 1704  Fri Oct 12, 2020  0952 Ultrasound concerning for acute cholecystitis   [JK]  1025 Patient is still having pain.  I will order additional medications.  I will consult general surgery.   [ON]  6295 Discussed with  general surgery.  Plan is to transfer to Pre op area, wl.   [JK]    Clinical Course User Index [JK] Dorie Rank, MD   MDM Rules/Calculators/A&P                          Ot presented with acute abdominal pain.  Sx concerning for possible cholecystitis, pancreatitis.  Obstruction less likely.  ED workup notable for [place] consistent with  acute cholecystitis.  Pt started on IV abx and pain medications.  Case discussed with Dr Zenia Resides.  Pt transferred to [facility] ED for operative intervention. Final Clinical Impression(s) / ED Diagnoses Final diagnoses:  Cholecystitis  Hypokalemia      Dorie Rank, MD 10/12/20 1708

## 2020-10-12 NOTE — Op Note (Signed)
Date: 10/12/20  Patient: [name] MRN: [id number]  Preoperative Diagnosis: Acute cholecystitis Postoperative Diagnosis: Same  Procedure: Laparoscopic cholecystectomy  Surgeon: Michaelle Birks, MD  EBL: Minimal  Anesthesia: General  Specimens: Gallbladder  Indications: Ms. Janet Webb is a 49 yo female who has been having intermittent postprandial abdominal pain for the last few months. Yesterday she began having severe epigastric and RUQ pain that has not improved. She presented to the ED. RUQ US showed pericholecystic fluid and gallbladder wall thickening, consistent with acute cholecystitis.  Findings: Acute cholecystitis with cholelithiasis.  Procedure details: Informed consent was obtained in the preoperative area prior to the procedure. The patient was brought to the operating room and placed on the table in the supine position. General anesthesia was induced and appropriate lines and drains were placed for intraoperative monitoring. Perioperative antibiotics were administered per SCIP guidelines. The abdomen was prepped and draped in the usual sterile fashion. A pre-procedure timeout was taken verifying patient identity, surgical site and procedure to be performed.  A small supraumbilical skin incision was made, the subcutaneous tissue was divided with cautery, and the umbilical stalk was grasped and elevated. The fascia was incised and the peritoneal cavity was directly visualized. A 89m Hassan trocar was placed. The peritoneal cavity was inspected with no evidence of visceral or vascular injury. Three 528mports were placed in the right subcostal margin, all under direct visualization. The gallbladder was very distended and difficult to grasp, so it was first decompressed with a needle inserted through the fundus. The fundus of the gallbladder was then grasped and retracted cephalad. There were thin filmy adhesions between the duodenum and infundibulum of the gallbladder, and  these were taken down carefully with cautery. The infundibulum was retracted laterally. The cystic triangle was dissected out using cautery and blunt dissection, and the critical view of safety was obtained. The cystic duct and cystic artery were clipped and ligated. The cystic duct was large in diameter so a PDS endoloop was also placed on the cystic duct stump. The gallbladder was taken off the liver using cautery. The specimen was placed in an endocatch bag and removed. The surgical site was irrigated with saline until the effluent was clear. The gallbladder fossa was hemostatic. The cystic duct and artery stumps were visually inspected and there was no evidence of bile leak or bleeding. The ports were removed under direct visualization and the abdomen was desufflated. The umbilical port site fascia was closed with a 0 vicryl suture. The skin at all port sites was closed with 4-0 monocryl subcuticular suture. Dermabond was applied.  All counts were correct x2 at the end of the procedure. The patient was extubated and taken to PACU in stable condition.  ShMichaelle BirksMD 10/12/20 3:47 PM

## 2020-10-12 NOTE — ED Triage Notes (Signed)
Reports epigastric pain , denies NVD. No urinary symptoms

## 2020-10-12 NOTE — H&P (Signed)
[facility] Admission Note  Scotti Motter [facility] Dec 20, 1971  [id number].    Requesting MD: Tomi Bamberger Chief Complaint/Reason for Consult: Acute cholecystitis   HPI:  Patient is a 49 year old female who presented to [facility] with several month history of intermittent abdominal pain. Patient reports that pain initially was only occurring with eating certain foods but has more recently been happening every time she eats and is increasing in severity. The current episode began yesterday and has been very severe. She is still having pain. Denies fever, chills, chest pain, SOB, vomiting, diarrhea, urinary symptoms. PMH otherwise significant for HTN, HLD, hx of nephrolithiasis. No prior abdominal surgeries. She does not take any blood thinners. COVID test is negative.  ROS: Review of Systems  Constitutional: Negative for chills and fever.  HENT: Negative for hearing loss.   Eyes: Negative for redness.  Respiratory: Negative for shortness of breath, wheezing and stridor.   Cardiovascular: Negative for chest pain and palpitations.  Gastrointestinal: Positive for abdominal pain. Negative for constipation, diarrhea and vomiting.  Genitourinary: Negative for dysuria, frequency and urgency.  Musculoskeletal: Negative for neck pain.  Skin: Negative for rash.  Neurological: Negative for seizures and headaches.  Psychiatric/Behavioral: Negative for memory loss. The patient does not have insomnia.   All other systems reviewed and are negative.   History reviewed. No pertinent family history.  Past Medical History:  Diagnosis Date  . Family history of adverse reaction to anesthesia    mother - severe PONV  . High cholesterol   . Hypertension   . Hypertrophy of nasal turbinates   . Kidney stones   . PONV (postoperative nausea and vomiting)   . Shoulder pain, right   . Sinusitis, chronic   . Vertigo    positional    Past Surgical History:  Procedure Laterality Date  . ENDOSCOPIC CONCHA  BULLOSA RESECTION Bilateral 11/08/2015   Procedure: ENDOSCOPIC CONCHA BULLOSA REDUCTION MIDDLE TURBINATES;  Surgeon: Margaretha Sheffield, MD;  Location: [facility];  Service: ENT;  Laterality: Bilateral;  . LITHOTRIPSY    . TURBINATE REDUCTION Bilateral 11/08/2015   Procedure: TURBINATE REDUCTION;  Surgeon: Margaretha Sheffield, MD;  Location: [facility];  Service: ENT;  Laterality: Bilateral;    Social History:  reports that she has never smoked. She has never used smokeless tobacco. She reports current alcohol use of about 7.0 standard drinks of alcohol per week. She reports that she does not use drugs.  Allergies:  Allergies  Allergen Reactions  . Codeine Hives    And vomiting  . Percocet [Oxycodone-Acetaminophen] Nausea Only    hives  . Tramadol Nausea Only    hives    Medications Prior to Admission  Medication Sig Dispense Refill  . ezetimibe (ZETIA) 10 MG tablet Take 10 mg by mouth daily.    . fluticasone (FLONASE) 50 MCG/ACT nasal spray Place 1 spray into both nostrils daily. AM    . HYDROCHLOROTHIAZIDE PO Take 12.5 mg by mouth daily. PM    . lisinopril-hydrochlorothiazide (PRINZIDE,ZESTORETIC) 10-12.5 MG per tablet Take 1 tablet by mouth daily.    Marland Kitchen LOSARTAN POTASSIUM PO Take 25 mg by mouth daily. PM    . meloxicam (MOBIC) 7.5 MG tablet Take 1 tablet (7.5 mg total) by mouth daily. Take with food (Patient taking differently: Take 7.5 mg by mouth daily as needed. Take with food) 7 tablet 0  . methocarbamol (ROBAXIN) 750 MG tablet Take 1 tablet (750 mg total) by mouth 2 (two) times daily. 20 tablet 0  .  montelukast (SINGULAIR) 10 MG tablet Take 10 mg by mouth at bedtime.    . pravastatin (PRAVACHOL) 40 MG tablet Take 40 mg by mouth daily.      Blood pressure (!) 164/91, pulse 64, temperature 98.4 F (36.9 C), temperature source Oral, resp. rate 16, height 5' 5"  (1.651 m), weight 77.1 kg, SpO2 99 %. Physical Exam:  General: pleasant, WD, WN female who is laying in bed in  NAD HEENT: Sclera are noninjected.  PERRL.  Ears and nose without any masses or lesions.  Mouth is pink and moist Lungs: CTAB. Respiratory effort nonlabored Abd: soft, nondistended, RUQ and epigastric tenderness to palpation. No hernias. No surgical scars. MS: all 4 extremities are symmetrical with no cyanosis, clubbing, or edema. Skin: warm and dry with no masses, lesions, or rashes Neuro: Cranial nerves 2-12 grossly intact, speech is normal Psych: A&Ox3 with an appropriate affect.   Results for orders placed or performed during the hospital encounter of 10/12/20 (from the past 48 hour(s))  Lipase, blood     Status: None   Collection Time: 10/12/20  8:09 AM  Result Value Ref Range   Lipase 37 11 - 51 U/L    Comment: Performed at [facility], [address] [place], [place] [address]  Comprehensive metabolic panel     Status: Abnormal   Collection Time: 10/12/20  8:09 AM  Result Value Ref Range   Sodium 133 (L) 135 - 145 mmol/L   Potassium 2.8 (L) 3.5 - 5.1 mmol/L   Chloride 98 98 - 111 mmol/L   CO2 26 22 - 32 mmol/L   Glucose, Bld 148 (H) 70 - 99 mg/dL    Comment: Glucose reference range applies only to samples taken after fasting for at least 8 hours.   BUN 13 6 - 20 mg/dL   Creatinine, Ser 0.91 0.44 - 1.00 mg/dL   Calcium 8.6 (L) 8.9 - 10.3 mg/dL   Total Protein 7.1 6.5 - 8.1 g/dL   Albumin 3.9 3.5 - 5.0 g/dL   AST 30 15 - 41 U/L   ALT 30 0 - 44 U/L   Alkaline Phosphatase 44 38 - 126 U/L   Total Bilirubin 0.5 0.3 - 1.2 mg/dL   GFR, Estimated >60 >60 mL/min   Anion gap 9 5 - 15    Comment: Performed at [facility], [address] [place], [place] [address]  CBC     Status: Abnormal   Collection Time: 10/12/20  8:09 AM  Result Value Ref Range   WBC 11.0 (H) 4.0 - 10.5 K/uL   RBC 4.54 3.87 - 5.11 MIL/uL   Hemoglobin 12.9 12.0 - 15.0 g/dL   HCT 39.5 36 - 46 %   MCV 87.0 80.0 - 100.0 fL   MCH 28.4 26.0 - 34.0 pg   MCHC 32.7 30.0 - 36.0 g/dL   RDW  13.3 11.5 - 15.5 %   Platelets 305 150 - 400 K/uL   nRBC 0.0 0.0 - 0.2 %    Comment: Performed at [facility], [address] [place], [place] [address]  Urinalysis, Routine w reflex microscopic Urine, Clean Catch     Status: Abnormal   Collection Time: 10/12/20  9:00 AM  Result Value Ref Range   Color, Urine YELLOW YELLOW   APPearance CLOUDY (A) CLEAR   Specific Gravity, Urine 1.020 1.005 - 1.030   pH 8.0 5.0 - 8.0   Glucose, UA NEGATIVE NEGATIVE mg/dL  Hgb urine dipstick NEGATIVE NEGATIVE   Bilirubin Urine NEGATIVE NEGATIVE   Ketones, ur NEGATIVE NEGATIVE mg/dL   Protein, ur NEGATIVE NEGATIVE mg/dL   Nitrite NEGATIVE NEGATIVE   Leukocytes,Ua NEGATIVE NEGATIVE    Comment: Microscopic not done on urines with negative protein, blood, leukocytes, nitrite, or glucose < 500 mg/dL. Performed at [facility], [address] [place], [place] [address]   Pregnancy, urine     Status: None   Collection Time: 10/12/20  9:00 AM  Result Value Ref Range   Preg Test, Ur NEGATIVE NEGATIVE    Comment:        THE SENSITIVITY OF THIS METHODOLOGY IS >20 mIU/mL. Performed at [facility], [address] [place], [place] [address]   Respiratory Panel by RT PCR (Flu A&B, Covid) - Nasopharyngeal Swab     Status: None   Collection Time: 10/12/20 10:27 AM   Specimen: Nasopharyngeal Swab  Result Value Ref Range   SARS Coronavirus 2 by RT PCR NEGATIVE NEGATIVE    Comment: (NOTE) SARS-CoV-2 target nucleic acids are NOT DETECTED.  The SARS-CoV-2 RNA is generally detectable in upper respiratoy specimens during the acute phase of infection. The lowest concentration of SARS-CoV-2 viral copies this assay can detect is 131 copies/mL. A negative result does not preclude SARS-Cov-2 infection and should not be used as the sole basis for treatment or other patient management decisions. A negative result may occur with  improper specimen collection/handling, submission of  specimen other than nasopharyngeal swab, presence of viral mutation(s) within the areas targeted by this assay, and inadequate number of viral copies (<131 copies/mL). A negative result must be combined with clinical observations, patient history, and epidemiological information. The expected result is Negative.  Fact Sheet for Patients:  PinkCheek.be  Fact Sheet for Healthcare Providers:  GravelBags.it  This test is no t yet approved or cleared by the [place] FDA and  has been authorized for detection and/or diagnosis of SARS-CoV-2 by FDA under an Emergency Use Authorization (EUA). This EUA will remain  in effect (meaning this test can be used) for the duration of the COVID-19 declaration under Section 564(b)(1) of the Act, 21 U.S.C. section 360bbb-3(b)(1), unless the authorization is terminated or revoked sooner.     Influenza A by PCR NEGATIVE NEGATIVE   Influenza B by PCR NEGATIVE NEGATIVE    Comment: (NOTE) The Xpert Xpress SARS-CoV-2/FLU/RSV assay is intended as an aid in  the diagnosis of influenza from Nasopharyngeal swab specimens and  should not be used as a sole basis for treatment. Nasal washings and  aspirates are unacceptable for Xpert Xpress SARS-CoV-2/FLU/RSV  testing.  Fact Sheet for Patients: PinkCheek.be  Fact Sheet for Healthcare Providers: GravelBags.it  This test is not yet approved or cleared by the [place] FDA and  has been authorized for detection and/or diagnosis of SARS-CoV-2 by  FDA under an Emergency Use Authorization (EUA). This EUA will remain  in effect (meaning this test can be used) for the duration of the  Covid-19 declaration under Section 564(b)(1) of the Act, 21  U.S.C. section 360bbb-3(b)(1), unless the authorization is  terminated or revoked. Performed at [facility], [address],  [place], [place] [address]    US Abdomen Complete  Result Date: 10/12/2020 CLINICAL DATA:  Right upper quadrant pain EXAM: ABDOMEN ULTRASOUND COMPLETE COMPARISON:  None. FINDINGS: Gallbladder: Moderately distended gallbladder containing sludge and stones. The largest shadowing stone measures 2.3 cm in diameter.  Mild gallbladder wall thickening with pericholecystic fluid. A positive sonographic Percell Miller sign was noted. Common bile duct: Diameter: 3 mm. Liver: No focal lesion identified. Within normal limits in parenchymal echogenicity. Portal vein is patent on color Doppler imaging with normal direction of blood flow towards the liver. IVC: No abnormality visualized. Pancreas: Visualized portion unremarkable. Spleen: Size and appearance within normal limits. Right Kidney: Length: 10.5 cm. Echogenicity within normal limits. No mass or hydronephrosis visualized. Left Kidney: Length: 9.9 cm. Echogenicity within normal limits. No mass or hydronephrosis visualized. Abdominal aorta: No aneurysm visualized. Other findings: None. IMPRESSION: 1. Sonographic findings highly suspicious for acute calculus cholecystitis. 2. Remainder of the examination is within normal limits. Electronically Signed   By: Davina Poke D.O.   On: 10/12/2020 09:49      Assessment/Plan HTN HLD hx of nephrolithiasis  Acute cholecystitis  - Patient has focal RUQ tenderness on exam. US shows distended GB containing sludge and stones with mild GB wall thickening and pericholecystic fluid, positive sonographic murphy sign - WBC mildly elevated at 11, afebrile, VSS - LFTs and lipase all WNL - Proceed to OR for laparoscopic cholecystectomy. I discussed the details of the procedure with the patient, and the risks including bleeding, infection, and CBD injury. Recommend that we proceed with surgery and the patient agrees to proceed. - Plan for postop observation  Michaelle Birks, MD [facility] General, Hepatobiliary and  Pancreatic Surgery 10/12/20 1:28 PM

## 2020-10-12 NOTE — ED Notes (Signed)
PT NPO since 10 pm 10/11/2020

## 2020-10-12 NOTE — Transfer of Care (Signed)
Immediate Anesthesia Transfer of Care Note  Patient: Janet Webb  Procedure(s) Performed: LAPAROSCOPIC CHOLECYSTECTOMY (N/A Abdomen)  Patient Location: PACU  Anesthesia Type:General  Level of Consciousness: drowsy and patient cooperative  Airway & Oxygen Therapy: Patient Spontanous Breathing and Patient connected to face mask  Post-op Assessment: Report given to RN and Post -op Vital signs reviewed and stable  Post vital signs: Reviewed and stable  Last Vitals:  Vitals Value Taken Time  BP 140/79 10/12/20 1536  Temp    Pulse 80 10/12/20 1538  Resp 15 10/12/20 1538  SpO2 98 % 10/12/20 1538  Vitals shown include unvalidated device data.  Last Pain:  Vitals:   10/12/20 1303  TempSrc: Oral  PainSc:       Patients Stated Pain Goal: 7 (09/98/33 8250)  Complications: No complications documented.

## 2020-10-12 NOTE — Anesthesia Postprocedure Evaluation (Signed)
Anesthesia Post Note  Patient: Janet Webb  Procedure(s) Performed: LAPAROSCOPIC CHOLECYSTECTOMY (N/A Abdomen)     Patient location during evaluation: PACU Anesthesia Type: General Level of consciousness: awake and alert Pain management: pain level controlled Vital Signs Assessment: post-procedure vital signs reviewed and stable Respiratory status: spontaneous breathing, nonlabored ventilation, respiratory function stable and patient connected to nasal cannula oxygen Cardiovascular status: blood pressure returned to baseline and stable Postop Assessment: no apparent nausea or vomiting Anesthetic complications: no   No complications documented.  Last Vitals:  Vitals:   10/12/20 1600 10/12/20 1615  BP: (!) 159/85 (!) 133/96  Pulse: 82 78  Resp: 19 16  Temp:  (!) 36.3 C  SpO2: 97% 99%    Last Pain:  Vitals:   10/12/20 1615  TempSrc:   PainSc: 0-No pain       Pt vomited on induction of anesthesia. Immediate head down, suction and intubation. NG tube placed with approx 200 cc suctioned from stomach. No change in O2 saturations. Breath sounds normal intra-op and in PACU. Pt extubated uneventfully after procedure and was stable in PACU - saturation 100%. Will defer CXR unless signs or symptoms of aspiration.           [place]

## 2020-10-12 NOTE — Anesthesia Preprocedure Evaluation (Signed)
Anesthesia Evaluation  Patient identified by MRN, date of birth, ID band Patient awake    Reviewed: Allergy & Precautions, NPO status , Patient's Chart, lab work & pertinent test results  History of Anesthesia Complications (+) PONV  Airway Mallampati: I  TM Distance: >3 FB Neck ROM: Full    Dental   Pulmonary    Pulmonary exam normal        Cardiovascular hypertension, Normal cardiovascular exam     Neuro/Psych    GI/Hepatic   Endo/Other    Renal/GU      Musculoskeletal   Abdominal   Peds  Hematology   Anesthesia Other Findings   Reproductive/Obstetrics                             Anesthesia Physical Anesthesia Plan  ASA: III  Anesthesia Plan: General   Post-op Pain Management:    Induction: Intravenous, Cricoid pressure planned and Rapid sequence  PONV Risk Score and Plan: 4 or greater and Ondansetron and Midazolam  Airway Management Planned: Oral ETT  Additional Equipment:   Intra-op Plan:   Post-operative Plan:   Informed Consent: I have reviewed the patients History and Physical, chart, labs and discussed the procedure including the risks, benefits and alternatives for the proposed anesthesia with the patient or authorized representative who has indicated his/her understanding and acceptance.       Plan Discussed with: CRNA and Surgeon  Anesthesia Plan Comments:         Anesthesia Quick Evaluation

## 2020-10-13 ENCOUNTER — Encounter (HOSPITAL_COMMUNITY): Payer: Self-pay | Admitting: Surgery

## 2020-10-13 ENCOUNTER — Observation Stay (HOSPITAL_COMMUNITY): Payer: BC Managed Care – PPO

## 2020-10-13 LAB — COMPREHENSIVE METABOLIC PANEL
ALT: 251 U/L — ABNORMAL HIGH (ref 0–44)
AST: 246 U/L — ABNORMAL HIGH (ref 15–41)
Albumin: 3.1 g/dL — ABNORMAL LOW (ref 3.5–5.0)
Alkaline Phosphatase: 62 U/L (ref 38–126)
Anion gap: 6 (ref 5–15)
BUN: 9 mg/dL (ref 6–20)
CO2: 26 mmol/L (ref 22–32)
Calcium: 8.2 mg/dL — ABNORMAL LOW (ref 8.9–10.3)
Chloride: 105 mmol/L (ref 98–111)
Creatinine, Ser: 0.78 mg/dL (ref 0.44–1.00)
GFR, Estimated: >60 mL/min (ref >60)
Glucose, Bld: 128 mg/dL — ABNORMAL HIGH (ref 70–99)
Potassium: 3.9 mmol/L (ref 3.5–5.1)
Sodium: 137 mmol/L (ref 135–145)
Total Bilirubin: 0.8 mg/dL (ref 0.3–1.2)
Total Protein: 5.9 g/dL — ABNORMAL LOW (ref 6.5–8.1)

## 2020-10-13 LAB — CBC
HCT: 36.4 % (ref 36.0–46.0)
Hemoglobin: 12 g/dL (ref 12.0–15.0)
MCH: 28.8 pg (ref 26.0–34.0)
MCHC: 33 g/dL (ref 30.0–36.0)
MCV: 87.3 fL (ref 80.0–100.0)
Platelets: 279 10*3/uL (ref 150–400)
RBC: 4.17 MIL/uL (ref 3.87–5.11)
RDW: 13.5 % (ref 11.5–15.5)
WBC: 21.8 10*3/uL — ABNORMAL HIGH (ref 4.0–10.5)
nRBC: 0 % (ref 0.0–0.2)

## 2020-10-13 NOTE — Progress Notes (Signed)
1 Day Post-Op   Subjective/Chief Complaint: MILD COUGH  Pt with mild cough Had a URI prior to admission  No abdominal pain   Objective: Vital signs in last 24 hours: Temp:  [97.3 F (36.3 C)-98.9 F (37.2 C)] 98.9 F (37.2 C) (10/16 0521) Pulse Rate:  [61-97] 90 (10/16 0521) Resp:  [14-20] 16 (10/15 1851) BP: (126-165)/(73-99) 126/78 (10/16 0521) SpO2:  [91 %-100 %] 91 % (10/16 0521) Weight:  [77.1 kg] 77.1 kg (10/15 1301) Last BM Date: 10/12/20  Intake/Output from previous day: 10/15 0701 - 10/16 0700 In: 3023.5 [P.O.:840; I.V.:1032.7; IV Piggyback:1150.8] Out: 1410 [Urine:1200; Blood:10] Intake/Output this shift: No intake/output data recorded.  General appearance: alert, cooperative and appears stated age Resp: clear to auscultation bilaterally Cardio: regular rate and rhythm, S1, S2 normal, no murmur, click, rub or gallop Incision/Wound:port sites CDI Soft mild soreness diffuse   Lab Results:  Recent Labs    10/12/20 0809 10/13/20 0636  WBC 11.0* 21.8*  HGB 12.9 12.0  HCT 39.5 36.4  PLT 305 279   BMET Recent Labs    10/12/20 0809 10/13/20 0636  NA 133* 137  K 2.8* 3.9  CL 98 105  CO2 26 26  GLUCOSE 148* 128*  BUN 13 9  CREATININE 0.91 0.78  CALCIUM 8.6* 8.2*   PT/INR No results for input(s): LABPROT, INR in the last 72 hours. ABG No results for input(s): PHART, HCO3 in the last 72 hours.  Invalid input(s): PCO2, PO2  Studies/Results: US Abdomen Complete  Result Date: 10/12/2020 CLINICAL DATA:  Right upper quadrant pain EXAM: ABDOMEN ULTRASOUND COMPLETE COMPARISON:  None. FINDINGS: Gallbladder: Moderately distended gallbladder containing sludge and stones. The largest shadowing stone measures 2.3 cm in diameter. Mild gallbladder wall thickening with pericholecystic fluid. A positive sonographic Percell Miller sign was noted. Common bile duct: Diameter: 3 mm. Liver: No focal lesion identified. Within normal limits in parenchymal echogenicity. Portal vein  is patent on color Doppler imaging with normal direction of blood flow towards the liver. IVC: No abnormality visualized. Pancreas: Visualized portion unremarkable. Spleen: Size and appearance within normal limits. Right Kidney: Length: 10.5 cm. Echogenicity within normal limits. No mass or hydronephrosis visualized. Left Kidney: Length: 9.9 cm. Echogenicity within normal limits. No mass or hydronephrosis visualized. Abdominal aorta: No aneurysm visualized. Other findings: None. IMPRESSION: 1. Sonographic findings highly suspicious for acute calculus cholecystitis. 2. Remainder of the examination is within normal limits. Electronically Signed   By: Davina Poke D.O.   On: 10/12/2020 09:49    Anti-infectives: Anti-infectives (From admission, onward)   Start     Dose/Rate Route Frequency Ordered Stop   10/12/20 1359  metroNIDAZOLE (FLAGYL) 5-0.79 MG/ML-% IVPB       Note to Pharmacy: Virgia Land   : cabinet override      10/12/20 1359 10/12/20 1404   10/12/20 1329  ceFAZolin (ANCEF) 2-4 GM/100ML-% IVPB       Note to Pharmacy: Dellie Catholic   : cabinet override      10/12/20 1329 10/13/20 0144      Assessment/Plan: s/p Procedure(s): LAPAROSCOPIC CHOLECYSTECTOMY (N/A) HTN HLD hx of nephrolithiasis   Possible aspiration at induction-WBC up this am Check CXR- sats ok and no SOB-  Follow WBC  HOME TOMORROW IF LABS CXR ACCEPTABLE  Ambulate  Continue diet   LOS: 0 days    Turner Daniels MD  10/13/2020

## 2020-10-14 ENCOUNTER — Encounter (HOSPITAL_COMMUNITY): Payer: Self-pay

## 2020-10-14 LAB — CBC
HCT: 39.4 % (ref 36.0–46.0)
Hemoglobin: 12.9 g/dL (ref 12.0–15.0)
MCH: 28.5 pg (ref 26.0–34.0)
MCHC: 32.7 g/dL (ref 30.0–36.0)
MCV: 87 fL (ref 80.0–100.0)
Platelets: 281 10*3/uL (ref 150–400)
RBC: 4.53 MIL/uL (ref 3.87–5.11)
RDW: 13.7 % (ref 11.5–15.5)
WBC: 16 10*3/uL — ABNORMAL HIGH (ref 4.0–10.5)
nRBC: 0 % (ref 0.0–0.2)

## 2020-10-14 LAB — COMPREHENSIVE METABOLIC PANEL
ALT: 243 U/L — ABNORMAL HIGH (ref 0–44)
AST: 173 U/L — ABNORMAL HIGH (ref 15–41)
Albumin: 3.4 g/dL — ABNORMAL LOW (ref 3.5–5.0)
Alkaline Phosphatase: 101 U/L (ref 38–126)
Anion gap: 9 (ref 5–15)
BUN: 11 mg/dL (ref 6–20)
CO2: 25 mmol/L (ref 22–32)
Calcium: 8.1 mg/dL — ABNORMAL LOW (ref 8.9–10.3)
Chloride: 104 mmol/L (ref 98–111)
Creatinine, Ser: 0.89 mg/dL (ref 0.44–1.00)
GFR, Estimated: >60 mL/min (ref >60)
Glucose, Bld: 98 mg/dL (ref 70–99)
Potassium: 3.4 mmol/L — ABNORMAL LOW (ref 3.5–5.1)
Sodium: 138 mmol/L (ref 135–145)
Total Bilirubin: 2.1 mg/dL — ABNORMAL HIGH (ref 0.3–1.2)
Total Protein: 6.5 g/dL (ref 6.5–8.1)

## 2020-10-14 NOTE — Progress Notes (Signed)
2 Days Post-Op   Subjective/Chief Complaint: Tired but feels better than yesterday Working with IS Tolerating po Minimal abdominal pain   Objective: Vital signs in last 24 hours: Temp:  [98.3 F (36.8 C)-99.5 F (37.5 C)] 99.5 F (37.5 C) (10/17 0506) Pulse Rate:  [90-104] 104 (10/17 0506) Resp:  [16-24] 18 (10/17 0506) BP: (122-141)/(66-91) 135/91 (10/17 0506) SpO2:  [88 %-91 %] 88 % (10/17 0506) Last BM Date: 10/12/20  Intake/Output from previous day: 10/16 0701 - 10/17 0700 In: 2429.4 [P.O.:1500; I.V.:929.4] Out: 2225 [Urine:2225] Intake/Output this shift: No intake/output data recorded.  Exam: Awake and alert Lungs clear Abdomen soft, minimally tender  Lab Results:  Recent Labs    10/13/20 0636 10/13/20 2356  WBC 21.8* 16.0*  HGB 12.0 12.9  HCT 36.4 39.4  PLT 279 281   BMET Recent Labs    10/13/20 0636 10/13/20 2356  NA 137 138  K 3.9 3.4*  CL 105 104  CO2 26 25  GLUCOSE 128* 98  BUN 9 11  CREATININE 0.78 0.89  CALCIUM 8.2* 8.1*   PT/INR No results for input(s): LABPROT, INR in the last 72 hours. ABG No results for input(s): PHART, HCO3 in the last 72 hours.  Invalid input(s): PCO2, PO2  Studies/Results: US Abdomen Complete  Result Date: 10/12/2020 CLINICAL DATA:  Right upper quadrant pain EXAM: ABDOMEN ULTRASOUND COMPLETE COMPARISON:  None. FINDINGS: Gallbladder: Moderately distended gallbladder containing sludge and stones. The largest shadowing stone measures 2.3 cm in diameter. Mild gallbladder wall thickening with pericholecystic fluid. A positive sonographic Percell Miller sign was noted. Common bile duct: Diameter: 3 mm. Liver: No focal lesion identified. Within normal limits in parenchymal echogenicity. Portal vein is patent on color Doppler imaging with normal direction of blood flow towards the liver. IVC: No abnormality visualized. Pancreas: Visualized portion unremarkable. Spleen: Size and appearance within normal limits. Right Kidney:  Length: 10.5 cm. Echogenicity within normal limits. No mass or hydronephrosis visualized. Left Kidney: Length: 9.9 cm. Echogenicity within normal limits. No mass or hydronephrosis visualized. Abdominal aorta: No aneurysm visualized. Other findings: None. IMPRESSION: 1. Sonographic findings highly suspicious for acute calculus cholecystitis. 2. Remainder of the examination is within normal limits. Electronically Signed   By: Davina Poke D.O.   On: 10/12/2020 09:49   DG Chest Port 1 View  Result Date: 10/13/2020 CLINICAL DATA:  Cough.  One day status post cholecystectomy. EXAM: PORTABLE CHEST 1 VIEW COMPARISON:  February 14, 2008 FINDINGS: There is patchy airspace opacity throughout the lungs bilaterally without consolidation. Heart size and pulmonary vascularity are normal. No adenopathy. No bone lesions. IMPRESSION: Patchy airspace opacity bilaterally. The appearance raises question of atypical organism pneumonia. Correlation with COVID-19 status advised. No consolidation. Note that allergic type reaction or aspiration potentially could present with similar lung changes and must be considered differential considerations at this time. Heart size normal.  No adenopathy. Electronically Signed   By: Lowella Grip III M.D.   On: 10/13/2020 09:47    Anti-infectives: Anti-infectives (From admission, onward)   Start     Dose/Rate Route Frequency Ordered Stop   10/12/20 1359  metroNIDAZOLE (FLAGYL) 5-0.79 MG/ML-% IVPB       Note to Pharmacy: Virgia Land   : cabinet override      10/12/20 1359 10/12/20 1404   10/12/20 1329  ceFAZolin (ANCEF) 2-4 GM/100ML-% IVPB       Note to Pharmacy: Dellie Catholic   : cabinet override      10/12/20 1329 10/13/20 0144  Assessment/Plan: s/p Procedure(s): LAPAROSCOPIC CHOLECYSTECTOMY (N/A)  Possible aspiration at induction  WBC trending down T.bili up from 0.8 to 2.1  Will continue to observe Needs repeat CBC and repeat LFT's in the morning as well as  a CXR prior to considering discharge   LOS: 0 days    Coralie Keens 10/14/2020

## 2020-10-15 ENCOUNTER — Observation Stay (HOSPITAL_COMMUNITY): Payer: BC Managed Care – PPO

## 2020-10-15 DIAGNOSIS — I10 Essential (primary) hypertension: Secondary | ICD-10-CM | POA: Diagnosis present

## 2020-10-15 DIAGNOSIS — E876 Hypokalemia: Secondary | ICD-10-CM | POA: Diagnosis present

## 2020-10-15 DIAGNOSIS — Z885 Allergy status to narcotic agent status: Secondary | ICD-10-CM | POA: Diagnosis not present

## 2020-10-15 DIAGNOSIS — J329 Chronic sinusitis, unspecified: Secondary | ICD-10-CM | POA: Diagnosis present

## 2020-10-15 DIAGNOSIS — Z87442 Personal history of urinary calculi: Secondary | ICD-10-CM | POA: Diagnosis not present

## 2020-10-15 DIAGNOSIS — Z791 Long term (current) use of non-steroidal anti-inflammatories (NSAID): Secondary | ICD-10-CM | POA: Diagnosis not present

## 2020-10-15 DIAGNOSIS — R1010 Upper abdominal pain, unspecified: Secondary | ICD-10-CM | POA: Diagnosis present

## 2020-10-15 DIAGNOSIS — E785 Hyperlipidemia, unspecified: Secondary | ICD-10-CM | POA: Diagnosis present

## 2020-10-15 DIAGNOSIS — K8000 Calculus of gallbladder with acute cholecystitis without obstruction: Secondary | ICD-10-CM | POA: Diagnosis present

## 2020-10-15 DIAGNOSIS — Z79899 Other long term (current) drug therapy: Secondary | ICD-10-CM | POA: Diagnosis not present

## 2020-10-15 DIAGNOSIS — Z20822 Contact with and (suspected) exposure to covid-19: Secondary | ICD-10-CM | POA: Diagnosis present

## 2020-10-15 LAB — COMPREHENSIVE METABOLIC PANEL
ALT: 192 U/L — ABNORMAL HIGH (ref 0–44)
AST: 103 U/L — ABNORMAL HIGH (ref 15–41)
Albumin: 3.3 g/dL — ABNORMAL LOW (ref 3.5–5.0)
Alkaline Phosphatase: 137 U/L — ABNORMAL HIGH (ref 38–126)
Anion gap: 10 (ref 5–15)
BUN: 9 mg/dL (ref 6–20)
CO2: 22 mmol/L (ref 22–32)
Calcium: 8.2 mg/dL — ABNORMAL LOW (ref 8.9–10.3)
Chloride: 104 mmol/L (ref 98–111)
Creatinine, Ser: 0.73 mg/dL (ref 0.44–1.00)
GFR, Estimated: >60 mL/min (ref >60)
Glucose, Bld: 106 mg/dL — ABNORMAL HIGH (ref 70–99)
Potassium: 3.5 mmol/L (ref 3.5–5.1)
Sodium: 136 mmol/L (ref 135–145)
Total Bilirubin: 4.2 mg/dL — ABNORMAL HIGH (ref 0.3–1.2)
Total Protein: 6.6 g/dL (ref 6.5–8.1)

## 2020-10-15 LAB — CBC
HCT: 37.1 % (ref 36.0–46.0)
Hemoglobin: 12.2 g/dL (ref 12.0–15.0)
MCH: 28.8 pg (ref 26.0–34.0)
MCHC: 32.9 g/dL (ref 30.0–36.0)
MCV: 87.7 fL (ref 80.0–100.0)
Platelets: 264 10*3/uL (ref 150–400)
RBC: 4.23 MIL/uL (ref 3.87–5.11)
RDW: 13.9 % (ref 11.5–15.5)
WBC: 12.8 10*3/uL — ABNORMAL HIGH (ref 4.0–10.5)
nRBC: 0 % (ref 0.0–0.2)

## 2020-10-15 LAB — BILIRUBIN, DIRECT: Bilirubin, Direct: 2.6 mg/dL — ABNORMAL HIGH (ref 0.0–0.2)

## 2020-10-15 LAB — SURGICAL PATHOLOGY

## 2020-10-15 MED ORDER — GADOBUTROL 1 MMOL/ML IV SOLN
8.0000 mL | Freq: Once | INTRAVENOUS | Status: AC | PRN
  Administered 2020-10-15: 8 mL via INTRAVENOUS

## 2020-10-15 MED ORDER — LORAZEPAM 2 MG/ML IJ SOLN
0.5000 mg | Freq: Once | INTRAMUSCULAR | Status: AC
  Administered 2020-10-15: 0.5 mg via INTRAVENOUS
  Filled 2020-10-15: qty 1

## 2020-10-15 NOTE — Progress Notes (Signed)
[facility] Progress Note  3 Days Post-Op  Subjective: Patient denies abdominal pain or nausea. +flatus but no BM yet. Explained labs and plan for MRCP today and patient in agreement.   Objective: Vital signs in last 24 hours: Temp:  [98.9 F (37.2 C)-99.7 F (37.6 C)] 98.9 F (37.2 C) (10/18 0458) Pulse Rate:  [86-95] 87 (10/18 0458) Resp:  [17-21] 17 (10/17 2136) BP: (114-137)/(82-91) 114/82 (10/18 0458) SpO2:  [95 %-100 %] 95 % (10/18 0458) Last BM Date: 10/12/20  Intake/Output from previous day: 10/17 0701 - 10/18 0700 In: 720 [P.O.:720] Out: -  Intake/Output this shift: No intake/output data recorded.  PE: General: pleasant, WD, WN female who is laying in bed in NAD HEENT:  Sclera are anicteric.  PERRL.  Ears and nose without any masses or lesions.  Mouth is pink and moist Heart: regular, rate, and rhythm. Palpable radial and pedal pulses bilaterally Lungs: CTAB, no wheezes, rhonchi, or rales noted.  Respiratory effort nonlabored Abd: soft, NT, ND, +BS, incisions c/d/i    Lab Results:  Recent Labs    10/13/20 2356 10/15/20 0452  WBC 16.0* 12.8*  HGB 12.9 12.2  HCT 39.4 37.1  PLT 281 264   BMET Recent Labs    10/13/20 2356 10/15/20 0452  NA 138 136  K 3.4* 3.5  CL 104 104  CO2 25 22  GLUCOSE 98 106*  BUN 11 9  CREATININE 0.89 0.73  CALCIUM 8.1* 8.2*   PT/INR No results for input(s): LABPROT, INR in the last 72 hours. CMP     Component Value Date/Time   NA 136 10/15/2020 0452   K 3.5 10/15/2020 0452   CL 104 10/15/2020 0452   CO2 22 10/15/2020 0452   GLUCOSE 106 (H) 10/15/2020 0452   BUN 9 10/15/2020 0452   CREATININE 0.73 10/15/2020 0452   CALCIUM 8.2 (L) 10/15/2020 0452   PROT 6.6 10/15/2020 0452   ALBUMIN 3.3 (L) 10/15/2020 0452   AST 103 (H) 10/15/2020 0452   ALT 192 (H) 10/15/2020 0452   ALKPHOS 137 (H) 10/15/2020 0452   BILITOT 4.2 (H) 10/15/2020 0452   GFRNONAA >60 10/15/2020 0452   Lipase     Component Value  Date/Time   LIPASE 37 10/12/2020 0809       Studies/Results: DG CHEST PORT 1 VIEW  Result Date: 10/15/2020 CLINICAL DATA:  Aspiration EXAM: PORTABLE CHEST 1 VIEW COMPARISON:  10/13/2020 FINDINGS: Heart size and pulmonary vascularity are normal. Patchy perihilar and upper lobe infiltrates again demonstrated, likely unchanged given differences in technique. No developing consolidation or effusion. Mediastinal contours appear intact. IMPRESSION: Persistent bilateral perihilar and upper lobe infiltrates. Electronically Signed   By: Lucienne Capers M.D.   On: 10/15/2020 05:29    Anti-infectives: Anti-infectives (From admission, onward)   Start     Dose/Rate Route Frequency Ordered Stop   10/12/20 1359  metroNIDAZOLE (FLAGYL) 5-0.79 MG/ML-% IVPB       Note to Pharmacy: Virgia Land   : cabinet override      10/12/20 1359 10/12/20 1404   10/12/20 1329  ceFAZolin (ANCEF) 2-4 GM/100ML-% IVPB       Note to Pharmacy: Dellie Catholic   : cabinet override      10/12/20 1329 10/13/20 0144       Assessment/Plan HTN HLD hx of nephrolithiasis  Acute cholecystitis  S/p lap cholecystectomy 10/12/20 Dr. Zenia Resides - POD#3 - Tbili elevated to 4.2, indirect 2.6; AST/ALT trending down - MRCP today to evaluate for possible  retained stone  FEN: NPO, IVF VTE: lovenox ID: [email or web address] 10/15   LOS: 0 days    Norm Parcel , [facility] 10/15/2020, 10:24 AM Please see Amion for pager number during day hours 7:00am-4:30pm

## 2020-10-15 NOTE — Discharge Instructions (Signed)
[facility]  Please arrive at least 30 min before your appointment to complete your check in paperwork.  If you are unable to arrive 30 min prior to your appointment time we may have to cancel or reschedule you. LAPAROSCOPIC SURGERY: POST OP INSTRUCTIONS Always review your discharge instruction sheet given to you by the facility where your surgery was performed. IF YOU HAVE DISABILITY OR FAMILY LEAVE FORMS, YOU MUST BRING THEM TO THE OFFICE FOR PROCESSING.   DO NOT GIVE THEM TO YOUR DOCTOR.  PAIN CONTROL  1. First take acetaminophen (Tylenol) AND/or ibuprofen (Advil) to control your pain after surgery.  Follow directions on package.  Taking acetaminophen (Tylenol) and/or ibuprofen (Advil) regularly after surgery will help to control your pain and lower the amount of prescription pain medication you may need.  You should not take more than 4,000 mg (4 grams) of acetaminophen (Tylenol) in 24 hours.  You should not take ibuprofen (Advil), aleve, motrin, naprosyn or other NSAIDS if you have a history of stomach ulcers or chronic kidney disease.  2. A prescription for pain medication may be given to you upon discharge.  Take your pain medication as prescribed, if you still have uncontrolled pain after taking acetaminophen (Tylenol) or ibuprofen (Advil). 3. Use ice packs to help control pain. 4. If you need a refill on your pain medication, please contact your pharmacy.  They will contact our office to request authorization. Prescriptions will not be filled after 5pm or on week-ends.  HOME MEDICATIONS 5. Take your usually prescribed medications unless otherwise directed.  DIET 6. You should follow a light diet the first few days after arrival home.  Be sure to include lots of fluids daily. Avoid fatty, fried foods.   CONSTIPATION 7. It is common to experience some constipation after surgery and if you are taking pain medication.  Increasing fluid intake and taking a stool  softener (such as Colace) will usually help or prevent this problem from occurring.  A mild laxative (Milk of Magnesia or Miralax) should be taken according to package instructions if there are no bowel movements after 48 hours.  WOUND/INCISION CARE 8. Most patients will experience some swelling and bruising in the area of the incisions.  Ice packs will help.  Swelling and bruising can take several days to resolve.  9. Unless discharge instructions indicate otherwise, follow guidelines below  a. STERI-STRIPS - you may remove your outer bandages 48 hours after surgery, and you may shower at that time.  You have steri-strips (small skin tapes) in place directly over the incision.  These strips should be left on the skin for 7-10 days.   b. DERMABOND/SKIN GLUE - you may shower in 24 hours.  The glue will flake off over the next 2-3 weeks. 10. Any sutures or staples will be removed at the office during your follow-up visit.  ACTIVITIES 11. You may resume regular (light) daily activities beginning the next day--such as daily self-care, walking, climbing stairs--gradually increasing activities as tolerated.  You may have sexual intercourse when it is comfortable.  Refrain from any heavy lifting or straining until approved by your doctor. a. You may drive when you are no longer taking prescription pain medication, you can comfortably wear a seatbelt, and you can safely maneuver your car and apply brakes.  FOLLOW-UP 12. You should see your doctor in the office for a follow-up appointment approximately 2-3 weeks after your surgery.  You should have been given your post-op/follow-up appointment when  your surgery was scheduled.  If you did not receive a post-op/follow-up appointment, make sure that you call for this appointment within a day or two after you arrive home to insure a convenient appointment time.  OTHER INSTRUCTIONS  WHEN TO CALL YOUR DOCTOR: 1. Fever over 101.0 2. Inability to  urinate 3. Continued bleeding from incision. 4. Increased pain, redness, or drainage from the incision. 5. Increasing abdominal pain  The clinic staff is available to answer your questions during regular business hours.  Please dont hesitate to call and ask to speak to one of the nurses for clinical concerns.  If you have a medical emergency, go to the nearest emergency room or call 911.  A surgeon from [facility] is always on call at the hospital. [address] [place], [place], [place]  [address] ? [address], [place], [place]   [address] [phone] ? [phone] ? FAX [phone]

## 2020-10-16 LAB — CBC
HCT: 36.9 % (ref 36.0–46.0)
Hemoglobin: 12.1 g/dL (ref 12.0–15.0)
MCH: 28.6 pg (ref 26.0–34.0)
MCHC: 32.8 g/dL (ref 30.0–36.0)
MCV: 87.2 fL (ref 80.0–100.0)
Platelets: 271 10*3/uL (ref 150–400)
RBC: 4.23 MIL/uL (ref 3.87–5.11)
RDW: 13.8 % (ref 11.5–15.5)
WBC: 11.1 10*3/uL — ABNORMAL HIGH (ref 4.0–10.5)
nRBC: 0 % (ref 0.0–0.2)

## 2020-10-16 LAB — COMPREHENSIVE METABOLIC PANEL
ALT: 142 U/L — ABNORMAL HIGH (ref 0–44)
AST: 50 U/L — ABNORMAL HIGH (ref 15–41)
Albumin: 3.2 g/dL — ABNORMAL LOW (ref 3.5–5.0)
Alkaline Phosphatase: 125 U/L (ref 38–126)
Anion gap: 9 (ref 5–15)
BUN: 11 mg/dL (ref 6–20)
CO2: 22 mmol/L (ref 22–32)
Calcium: 8.3 mg/dL — ABNORMAL LOW (ref 8.9–10.3)
Chloride: 105 mmol/L (ref 98–111)
Creatinine, Ser: 0.74 mg/dL (ref 0.44–1.00)
GFR, Estimated: >60 mL/min (ref >60)
Glucose, Bld: 99 mg/dL (ref 70–99)
Potassium: 3.4 mmol/L — ABNORMAL LOW (ref 3.5–5.1)
Sodium: 136 mmol/L (ref 135–145)
Total Bilirubin: 1.2 mg/dL (ref 0.3–1.2)
Total Protein: 6.7 g/dL (ref 6.5–8.1)

## 2020-10-16 MED ORDER — ACETAMINOPHEN 325 MG PO TABS: 650.0000 mg | ORAL_TABLET | Freq: Four times a day (QID) | ORAL | Status: AC | PRN

## 2020-10-16 MED ORDER — METHOCARBAMOL 750 MG PO TABS: 750.0000 mg | ORAL_TABLET | Freq: Two times a day (BID) | ORAL | 0 refills | Status: AC | PRN

## 2020-10-16 NOTE — Progress Notes (Signed)
D/C instructions given to patient. Patient has no questions. NT or writer will wheel patient out once she is dressed and family is here

## 2020-10-16 NOTE — Discharge Summary (Signed)
[facility] Discharge Summary   Patient ID: Janet Webb MRN: [id number] DOB/AGE: 1971/09/08 49 y.o.  Admit date: 10/12/2020 Discharge date: 10/16/2020  Admitting Diagnosis: Acute cholecystitis  Discharge Diagnosis Patient Active Problem List   Diagnosis Date Noted  . Acute cholecystitis 10/12/2020    Consultants None  Imaging: MR 3D Recon At Scanner  Result Date: 10/15/2020 CLINICAL DATA:  Post laparoscopic cholecystectomy on 10/15. Cholelithiasis at time of laparoscopic cholecystectomy and concern for retained stone. EXAM: MRI ABDOMEN WITHOUT AND WITH CONTRAST (INCLUDING MRCP) TECHNIQUE: Multiplanar multisequence MR imaging of the abdomen was performed both before and after the administration of intravenous contrast. Heavily T2-weighted images of the biliary and pancreatic ducts were obtained, and three-dimensional MRCP images were rendered by post processing. CONTRAST:  32m GADAVIST GADOBUTROL 1 MMOL/ML IV SOLN COMPARISON:  Ultrasound evaluation from 10/12/2020 FINDINGS: Lower chest: Linear basilar airspace disease not well evaluated. This trace effusions. No dense consolidation. Hepatobiliary: No biliary duct dilation following cholecystectomy. T2 hypointense area within the gallbladder fossa. No focal, suspicious hepatic lesion. The evaluation of the biliary tree on dedicated MRCP sequences is limited due to motion artifact but there is no biliary duct distension. Heterogeneity in the gallbladder fossa with some edema.  No ascites. Pancreas: Normal intrinsic T1 signal without signs of peripancreatic edema. No focal pancreatic lesion. Spleen:  Spleen normal in size and contour.  No focal lesion. Adrenals/Urinary Tract: Adrenal glands are normal. Kidneys enhance symmetrically without suspicious focal lesion. Signs of renal cortical scarring bilaterally. Stomach/Bowel: Small hiatal hernia. Not well assessed. No acute gastrointestinal process to the extent evaluated on  MRI, not performed for bowel evaluation. Vascular/Lymphatic: Vascular structures including the portal vein are patent. No aneurysmal dilation of the abdominal aorta. Other:  No ascites. Musculoskeletal: No focal, suspicious bone lesion. IMPRESSION: 1. Postsurgical changes in the gallbladder fossa. No focal fluid collection. 2. No biliary duct dilation following cholecystectomy. The evaluation of the biliary tree on dedicated MRCP sequences is limited due to motion artifact but there is no biliary duct distension. No gross filling defects seen in the common bile duct. 3. Linear basilar airspace disease not well evaluated. Likely atelectasis. 4. Small hiatal hernia. 5. Signs of renal cortical scarring bilaterally. Electronically Signed   By: GZetta BillsM.D.   On: 10/15/2020 18:04   DG CHEST PORT 1 VIEW  Result Date: 10/15/2020 CLINICAL DATA:  Aspiration EXAM: PORTABLE CHEST 1 VIEW COMPARISON:  10/13/2020 FINDINGS: Heart size and pulmonary vascularity are normal. Patchy perihilar and upper lobe infiltrates again demonstrated, likely unchanged given differences in technique. No developing consolidation or effusion. Mediastinal contours appear intact. IMPRESSION: Persistent bilateral perihilar and upper lobe infiltrates. Electronically Signed   By: WLucienne CapersM.D.   On: 10/15/2020 05:29   MR ABDOMEN MRCP W WO CONTAST  Result Date: 10/15/2020 CLINICAL DATA:  Post laparoscopic cholecystectomy on 10/15. Cholelithiasis at time of laparoscopic cholecystectomy and concern for retained stone. EXAM: MRI ABDOMEN WITHOUT AND WITH CONTRAST (INCLUDING MRCP) TECHNIQUE: Multiplanar multisequence MR imaging of the abdomen was performed both before and after the administration of intravenous contrast. Heavily T2-weighted images of the biliary and pancreatic ducts were obtained, and three-dimensional MRCP images were rendered by post processing. CONTRAST:  838mGADAVIST GADOBUTROL 1 MMOL/ML IV SOLN COMPARISON:   Ultrasound evaluation from 10/12/2020 FINDINGS: Lower chest: Linear basilar airspace disease not well evaluated. This trace effusions. No dense consolidation. Hepatobiliary: No biliary duct dilation following cholecystectomy. T2 hypointense area within the gallbladder fossa. No focal, suspicious hepatic  lesion. The evaluation of the biliary tree on dedicated MRCP sequences is limited due to motion artifact but there is no biliary duct distension. Heterogeneity in the gallbladder fossa with some edema.  No ascites. Pancreas: Normal intrinsic T1 signal without signs of peripancreatic edema. No focal pancreatic lesion. Spleen:  Spleen normal in size and contour.  No focal lesion. Adrenals/Urinary Tract: Adrenal glands are normal. Kidneys enhance symmetrically without suspicious focal lesion. Signs of renal cortical scarring bilaterally. Stomach/Bowel: Small hiatal hernia. Not well assessed. No acute gastrointestinal process to the extent evaluated on MRI, not performed for bowel evaluation. Vascular/Lymphatic: Vascular structures including the portal vein are patent. No aneurysmal dilation of the abdominal aorta. Other:  No ascites. Musculoskeletal: No focal, suspicious bone lesion. IMPRESSION: 1. Postsurgical changes in the gallbladder fossa. No focal fluid collection. 2. No biliary duct dilation following cholecystectomy. The evaluation of the biliary tree on dedicated MRCP sequences is limited due to motion artifact but there is no biliary duct distension. No gross filling defects seen in the common bile duct. 3. Linear basilar airspace disease not well evaluated. Likely atelectasis. 4. Small hiatal hernia. 5. Signs of renal cortical scarring bilaterally. Electronically Signed   By: Zetta Bills M.D.   On: 10/15/2020 18:04    Procedures Dr. Zenia Resides (10/12/2020) - Laparoscopic Cholecystectomy  Hospital Course:  Janet Webb is a 49yo female who was transferred from [facility] to [facility] 10/15 with acute  cholecystitis.  She reports several month history of intermittent abdominal pain. Patient reports that pain initially was only occurring with eating certain foods but has more recently been happening every time she eats and is increasing in severity. The current episode began yesterday and has been very severe. Focal RUQ tenderness on exam. US shows distended GB containing sludge and stones with mild GB wall thickening and pericholecystic fluid, positive sonographic murphy sign. Patient was admitted and underwent procedure listed above.  Tolerated procedure well and was transferred to the floor.  Diet was advanced as tolerated.  On postop day #3 she was noted to have an elevation in her total bilirubin. MRCP obtained and revealed no biliary duct dilatation or gross fillign defect of the common bile duct. Bilirubin was trended and normalized on POD#4. On 10/19 the patient was voiding well, tolerating diet, having bowel function, ambulating well, pain well controlled, vital signs stable, incisions c/d/i and felt stable for discharge home.  Patient will follow up as below and knows to call with questions or concerns.    I have personally reviewed the patients medication history on the [place] controlled substance database.   Physical Exam: General:  Alert, NAD, pleasant, comfortable Pulm: rate and effort normal Abd:  Soft, ND, nontender, multiple lap incisions C/D/I  Allergies as of 10/16/2020      Reactions   Statins Other (See Comments)   myalgias   Codeine Hives   And vomiting   Percocet [oxycodone-acetaminophen] Nausea Only   hives   Tramadol Nausea Only   hives      Medication List    STOP taking these medications   amoxicillin 875 MG tablet Commonly known as: AMOXIL   fluconazole 100 MG tablet Commonly known as: DIFLUCAN     TAKE these medications   acetaminophen 325 MG tablet Commonly known as: TYLENOL Take 2 tablets (650 mg total) by mouth every 6 (six) hours as needed for mild  pain (or Fever >/= 101).   ezetimibe 10 MG tablet Commonly known as: ZETIA Take 10 mg by  mouth daily.   fexofenadine 180 MG tablet Commonly known as: ALLEGRA Take 180 mg by mouth daily.   fluticasone 50 MCG/ACT nasal spray Commonly known as: FLONASE Place 1 spray into both nostrils daily. AM   hydrochlorothiazide 25 MG tablet Commonly known as: HYDRODIURIL Take 25 mg by mouth daily.   meloxicam 7.5 MG tablet Commonly known as: Mobic Take 1 tablet (7.5 mg total) by mouth daily. Take with food What changed:   when to take this  reasons to take this   methocarbamol 750 MG tablet Commonly known as: ROBAXIN Take 1 tablet (750 mg total) by mouth 2 (two) times daily as needed for muscle spasms. What changed:   when to take this  reasons to take this   montelukast 10 MG tablet Commonly known as: SINGULAIR Take 10 mg by mouth at bedtime.   valsartan 40 MG tablet Commonly known as: DIOVAN Take 40 mg by mouth daily.   Vitamin D 50 MCG (2000 UT) Caps Take 2,000 Units by mouth daily.         Follow-up Information    [facility] Go on 11/01/2020.   Specialty: General Surgery Why: Follow up appointment scheduled for 3:45 PM. Please arrive 30 min prior to appointment time. Bring photo ID and insurance information.  Contact information: [address]  [place] [phone]               Signed: [place], [facility] 10/16/2020, 9:20 AM Please see Amion for pager number during day hours 7:00am-4:30pm

## 2021-10-24 IMAGING — MR MR ABDOMEN WO/W CM MRCP
23 of 25 series · 44 of 48 positions shown · IV contrast (gadavist)
Comparison: Ultrasound evaluation from 10/12/2020

CLINICAL DATA: Post laparoscopic cholecystectomy on [DATE].
Cholelithiasis at time of laparoscopic cholecystectomy and concern
for retained stone.

EXAM:
MRI ABDOMEN WITHOUT AND WITH CONTRAST (INCLUDING MRCP)
TECHNIQUE: Multiplanar multisequence MR imaging of the abdomen was performed
both before and after the administration of intravenous contrast.
Heavily T2-weighted images of the biliary and pancreatic ducts were
obtained, and three-dimensional MRCP images were rendered by post
processing.
CONTRAST:  8mL GADAVIST GADOBUTROL 1 MMOL/ML IV SOLN

[Series 3: T2 fat-sat · axial · 6.0mm · 1.19mm/px · 1 of 38 slices shown (1 of 3)]
[im 1/38]
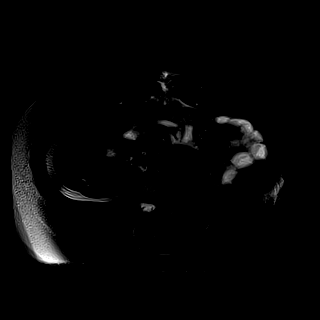

[Series 5: T2 · coronal · 6.0mm · 1.41mm/px · 1 of 30 slices shown (1 of 2)]
[im 1/30]
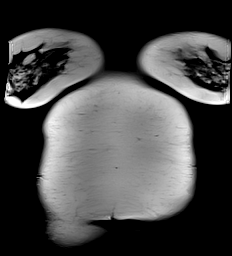

[Series 6: DWI · axial · 6.0mm · 1.49mm/px · 1 of 76 slices shown (1 of 4)]
[im 1/76]
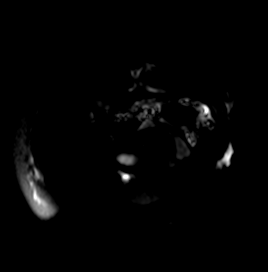

[Series 7: DWI · axial · 6.0mm · 1.49mm/px · 1 of 38 slices shown (2 of 4)]
[im 1/38]
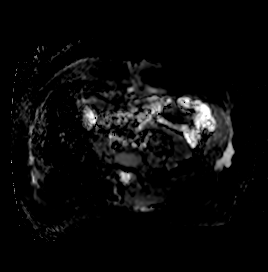

[Series 8: T2 fat-sat · axial · 6.0mm · 1.25mm/px · 1 of 38 slices shown (2 of 3)]
[im 1/38]
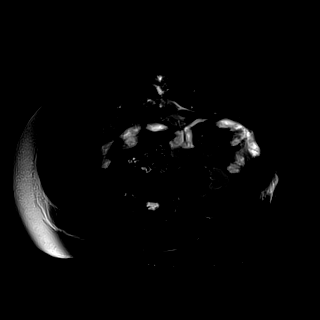

[Series 9: T2 fat-sat · 1 of 3 slices shown (3 of 3)]
[im 1/3]
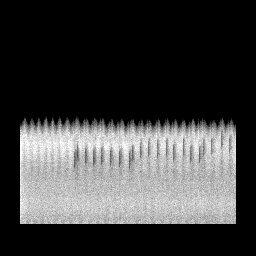

[Series 10: DWI · axial · 6.0mm · 1.49mm/px · 1 of 76 slices shown (3 of 4)]
[im 1/76]
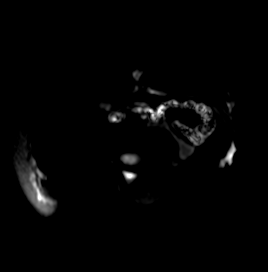

[Series 11: DWI · axial · 6.0mm · 1.49mm/px · 1 of 38 slices shown (4 of 4)]
[im 1/38]
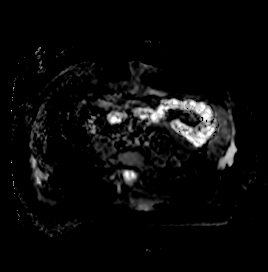

[Series 13: T1 · axial · 3.0mm · 1.19mm/px · z∈[-62,+199]mm · 3 of 88 slices shown (1 of 2)]
[im 1/88]
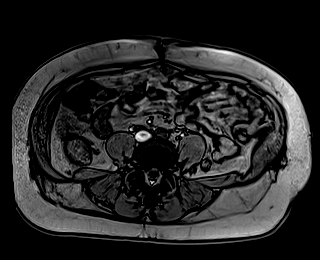
[im 44/88]
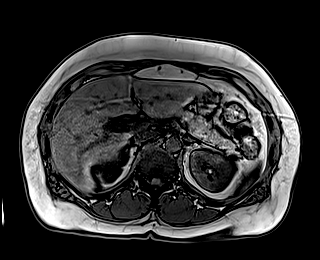
[im 88/88]
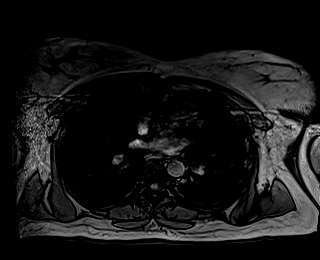

[Series 14: T1 · axial · 3.0mm · 1.19mm/px · z∈[-62,+199]mm · 3 of 88 slices shown (2 of 2)]
[im 1/88]
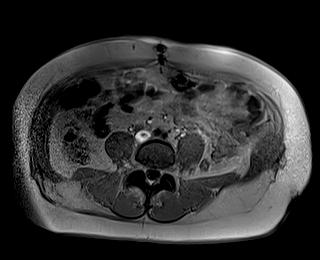
[im 44/88]
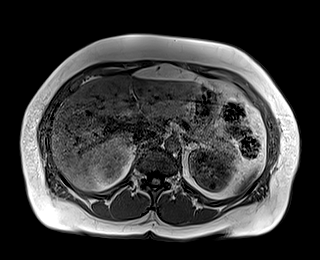
[im 88/88]
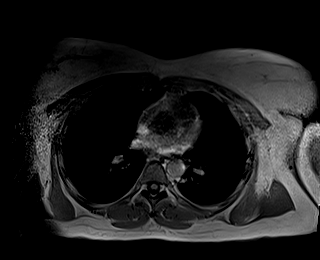

[Series 15: cor obl thk · sagittal · 50.0mm · 0.78mm/px · 1 of 9 slices shown]
[im 1/9]
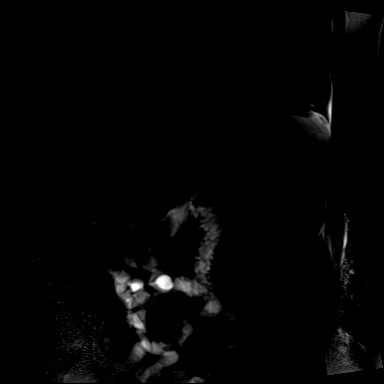

[Series 16: cor_3d_spc_trig-resp · 1 of 10 slices shown]
[im 1/10]
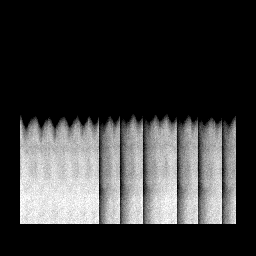

[Series 17: cor_3d_spc_trig · coronal · 1.0mm · 0.49mm/px · 2 of 72 slices shown]
[im 1/72]
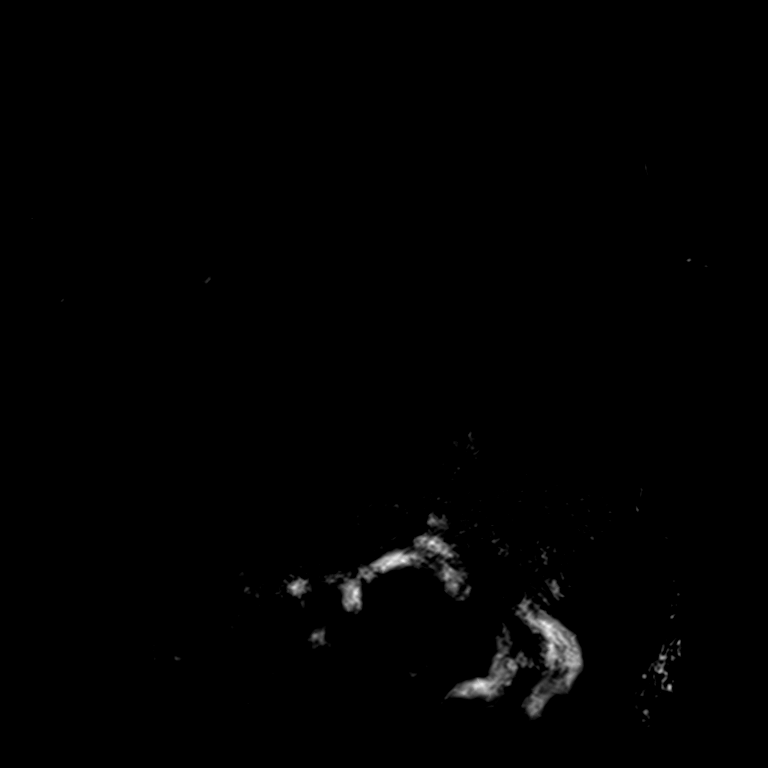
[im 72/72]
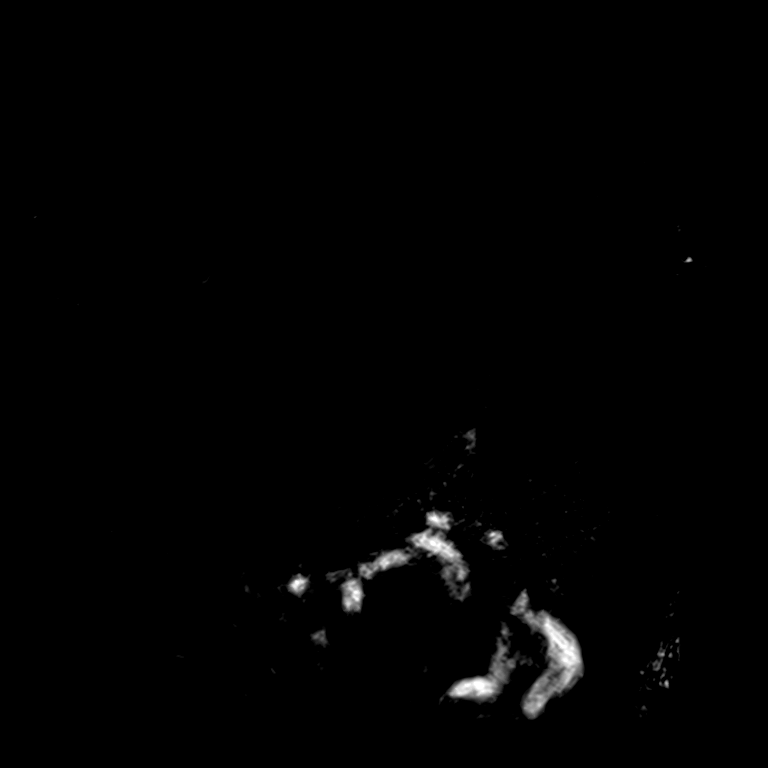

[Series 19: T2 · axial · 6.0mm · 1.41mm/px · 1 of 34 slices shown (2 of 2)]
[im 1/34]
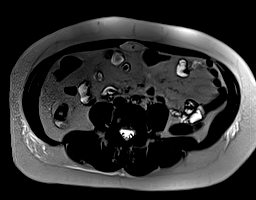

[Series 21: T1 dynamic · axial · 3.0mm · 1.12mm/px · z∈[-62,+199]mm · 3 of 88 slices shown (1 of 6)]
[im 1/88]
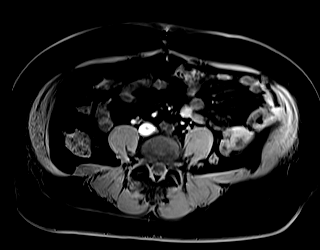
[im 44/88]
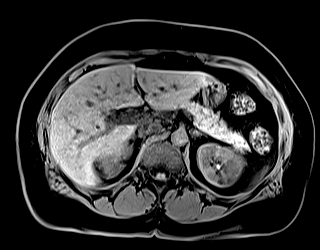
[im 88/88]
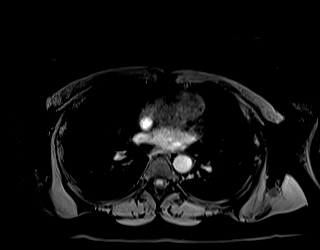

[Series 24: T1 dynamic · axial · 3.0mm · 1.12mm/px · z∈[-62,+199]mm · 3 of 88 slices shown (2 of 6)]
[im 1/88]
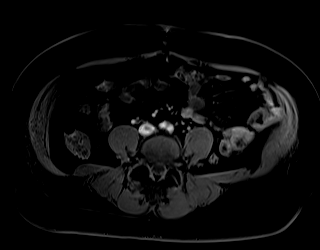
[im 44/88]
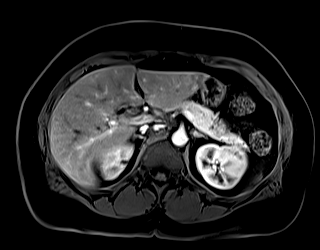
[im 88/88]
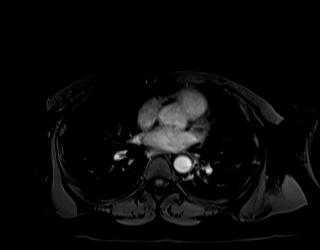

[Series 26: T1 dynamic · axial · 3.0mm · 1.12mm/px · z∈[-62,+199]mm · 3 of 88 slices shown (3 of 6)]
[im 1/88]
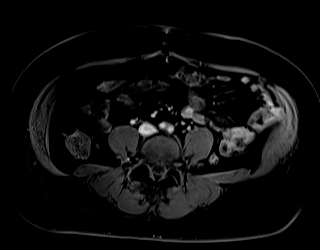
[im 44/88]
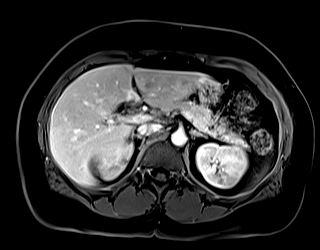
[im 88/88]
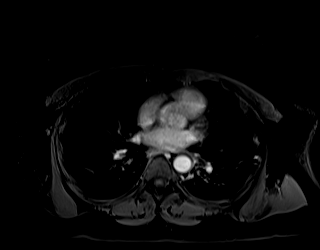

[Series 28: T1 dynamic · axial · 3.0mm · 1.12mm/px · z∈[-62,+199]mm · 3 of 88 slices shown (4 of 6)]
[im 1/88]
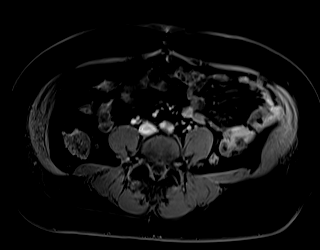
[im 44/88]
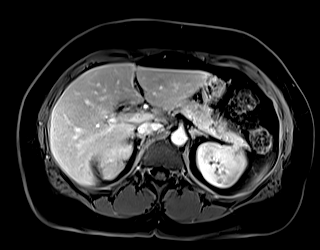
[im 88/88]
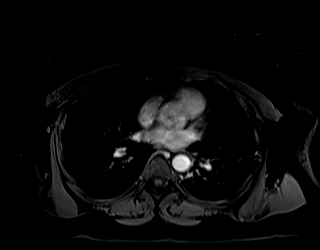

[Series 30: T1 dynamic · coronal · 5.0mm · 1.41mm/px · 2 of 52 slices shown (5 of 6)]
[im 1/52]
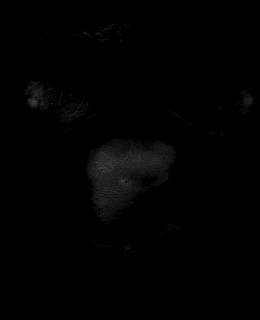
[im 52/52]
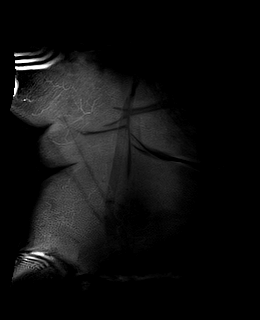

[Series 32: T1 dynamic · axial · 3.0mm · 1.12mm/px · z∈[-62,+199]mm · 3 of 88 slices shown (6 of 6)]
[im 1/88]
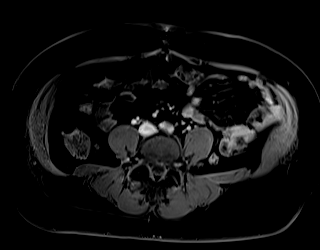
[im 44/88]
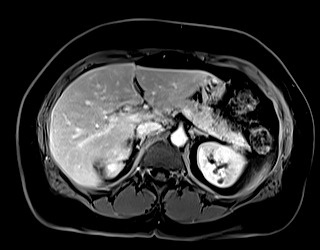
[im 88/88]
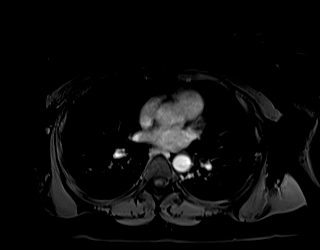

[Series 101: sub_(id) · axial · 3.0mm · 1.12mm/px · z∈[-62,+199]mm · 3 of 88 slices shown (1 of 3)]
[im 1/88]
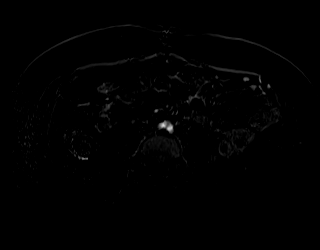
[im 44/88]
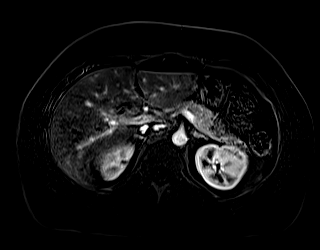
[im 88/88]
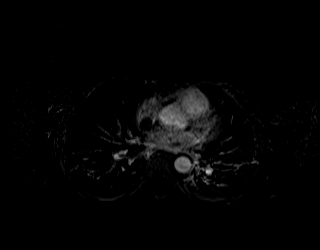

[Series 102: sub_(id) · axial · 3.0mm · 1.12mm/px · z∈[-62,+199]mm · 3 of 88 slices shown (2 of 3)]
[im 1/88]
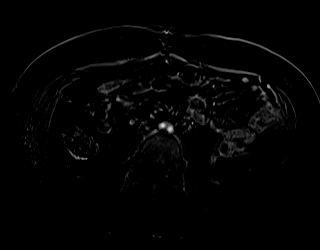
[im 44/88]
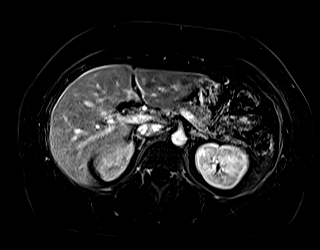
[im 88/88]
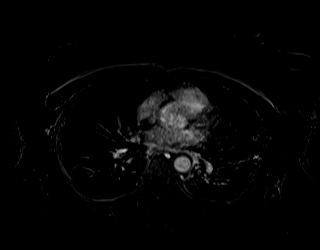

[Series 103: sub_(id) · axial · 3.0mm · 1.12mm/px · z∈[-62,+67]mm · 2 of 88 slices shown (3 of 3)]
[im 1/88]
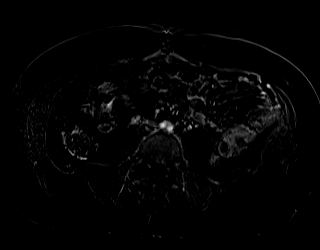
[im 44/88]
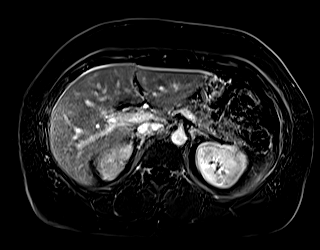

[44 of 48 positions shown; findings below may reference images not displayed]

FINDINGS: Lower chest: Linear basilar airspace disease not well evaluated.
This trace effusions. No dense consolidation.

Hepatobiliary: No biliary duct dilation following cholecystectomy.
T2 hypointense area within the gallbladder fossa. No focal,
suspicious hepatic lesion.

The evaluation of the biliary tree on dedicated MRCP sequences is
limited due to motion artifact but there is no biliary duct
distension.

Heterogeneity in the gallbladder fossa with some edema.  No ascites.

Pancreas: Normal intrinsic T1 signal without signs of peripancreatic
edema. No focal pancreatic lesion.

Spleen:  Spleen normal in size and contour.  No focal lesion.

Adrenals/Urinary Tract: Adrenal glands are normal. Kidneys enhance
symmetrically without suspicious focal lesion. Signs of renal
cortical scarring bilaterally.

Stomach/Bowel: Small hiatal hernia. Not well assessed. No acute
gastrointestinal process to the extent evaluated on MRI, not
performed for bowel evaluation.

Vascular/Lymphatic: Vascular structures including the portal vein
are patent. No aneurysmal dilation of the abdominal aorta.

Other:  No ascites.

Musculoskeletal: No focal, suspicious bone lesion.
IMPRESSION: 1. Postsurgical changes in the gallbladder fossa. No focal fluid
collection.
2. No biliary duct dilation following cholecystectomy. The
evaluation of the biliary tree on dedicated MRCP sequences is
limited due to motion artifact but there is no biliary duct
distension. No gross filling defects seen in the common bile duct.
3. Linear basilar airspace disease not well evaluated. Likely
atelectasis.
4. Small hiatal hernia.
5. Signs of renal cortical scarring bilaterally.
